# Patient Record
Sex: Female | Born: 1975 | Race: White | Hispanic: Yes | Marital: Married | Smoking: Never smoker
Health system: Southern US, Community
[De-identification: ages and names within clinical notes are randomized; demographics above are authoritative.]

## PROBLEM LIST (undated history)

## (undated) DIAGNOSIS — Z8619 Personal history of other infectious and parasitic diseases: Secondary | ICD-10-CM

## (undated) DIAGNOSIS — E7439 Other disorders of intestinal carbohydrate absorption: Secondary | ICD-10-CM

## (undated) DIAGNOSIS — O24419 Gestational diabetes mellitus in pregnancy, unspecified control: Secondary | ICD-10-CM

## (undated) HISTORY — DX: Other disorders of intestinal carbohydrate absorption: E74.39

## (undated) HISTORY — DX: Personal history of other infectious and parasitic diseases: Z86.19

---

## 2018-04-25 LAB — OB RESULTS CONSOLE GC/CHLAMYDIA
Chlamydia: NEGATIVE
Gonorrhea: NEGATIVE

## 2018-04-25 LAB — OB RESULTS CONSOLE GBS: GBS: NEGATIVE

## 2018-10-16 LAB — CYTOLOGY - PAP: Pap: NEGATIVE

## 2018-10-16 LAB — OB RESULTS CONSOLE PLATELET COUNT: Platelets: 268

## 2018-10-16 LAB — OB RESULTS CONSOLE ANTIBODY SCREEN: Antibody Screen: NEGATIVE

## 2018-10-16 LAB — GLUCOSE, 1 HOUR: Glucose 1 Hour: 202

## 2018-10-16 LAB — OB RESULTS CONSOLE HGB/HCT, BLOOD
HCT: 38 (ref 29–41)
Hemoglobin: 12.6

## 2018-10-16 LAB — OB RESULTS CONSOLE ABO/RH: RH Type: POSITIVE

## 2018-10-16 LAB — OB RESULTS CONSOLE RPR: RPR: NONREACTIVE

## 2018-10-16 LAB — OB RESULTS CONSOLE VARICELLA ZOSTER ANTIBODY, IGG: Varicella: NON-IMMUNE/NOT IMMUNE

## 2018-10-16 LAB — OB RESULTS CONSOLE RUBELLA ANTIBODY, IGM: Rubella: IMMUNE

## 2018-10-16 LAB — OB RESULTS CONSOLE HEPATITIS B SURFACE ANTIGEN: Hepatitis B Surface Ag: NEGATIVE

## 2018-10-16 LAB — CULTURE, OB URINE

## 2018-10-16 LAB — OB RESULTS CONSOLE HIV ANTIBODY (ROUTINE TESTING): HIV: NONREACTIVE

## 2018-10-23 LAB — GLUCOSE, 3 HOUR GESTATIONAL

## 2018-12-06 NOTE — L&D Delivery Note (Signed)
Patient: Heidi Guzman MRN: [id number]  GBS status: Negative, IAP given: None   Patient is a 43 y.o. now G4P4 s/p NSVD at [redacted]w[redacted]d, who was admitted for IOL for Pre-E. SROM 0h 15m prior to delivery with clear fluid.    Delivery Note At 7:09 PM a viable female was delivered via Vaginal, Spontaneous (Presentation: ROA).  APGAR: 9, 9; weight pending.   Placenta status: spontaneous, intact.  Cord: 3 vessel with the following complications: none.    Anesthesia: None     Episiotomy: None Lacerations: None Suture Repair: None Est. Blood Loss (mL): 100  Head delivered ROA. No nuchal cord present. Shoulder and body delivered in usual fashion. Infant with spontaneous cry, placed on mother's abdomen, dried and bulb suctioned. Terminal meconium present. Cord clamped x 2 after 1-minute delay, and cut by family member. Cord blood drawn. Placenta delivered spontaneously with gentle cord traction. Fundus firm with massage and Pitocin. Perineum inspected and found to have no lacerations.  Mom to postpartum.  Baby to Couplet care / Skin to Skin.  De Hollingshead 05/01/2019, 7:48 PM

## 2018-12-07 ENCOUNTER — Encounter: Payer: Self-pay | Admitting: *Deleted

## 2018-12-07 ENCOUNTER — Encounter: Payer: Self-pay | Admitting: Obstetrics & Gynecology

## 2018-12-07 ENCOUNTER — Ambulatory Visit (INDEPENDENT_AMBULATORY_CARE_PROVIDER_SITE_OTHER): Payer: Medicaid Other | Admitting: Obstetrics & Gynecology

## 2018-12-07 ENCOUNTER — Other Ambulatory Visit: Payer: Self-pay

## 2018-12-07 ENCOUNTER — Encounter: Payer: Self-pay | Attending: Obstetrics and Gynecology | Admitting: *Deleted

## 2018-12-07 ENCOUNTER — Ambulatory Visit: Payer: Self-pay | Admitting: *Deleted

## 2018-12-07 DIAGNOSIS — O0992 Supervision of high risk pregnancy, unspecified, second trimester: Secondary | ICD-10-CM | POA: Diagnosis not present

## 2018-12-07 DIAGNOSIS — O09529 Supervision of elderly multigravida, unspecified trimester: Secondary | ICD-10-CM

## 2018-12-07 DIAGNOSIS — O24419 Gestational diabetes mellitus in pregnancy, unspecified control: Secondary | ICD-10-CM | POA: Diagnosis not present

## 2018-12-07 DIAGNOSIS — O0990 Supervision of high risk pregnancy, unspecified, unspecified trimester: Secondary | ICD-10-CM | POA: Insufficient documentation

## 2018-12-07 DIAGNOSIS — O09522 Supervision of elderly multigravida, second trimester: Secondary | ICD-10-CM | POA: Diagnosis not present

## 2018-12-07 DIAGNOSIS — Z3A00 Weeks of gestation of pregnancy not specified: Secondary | ICD-10-CM | POA: Insufficient documentation

## 2018-12-07 DIAGNOSIS — Z713 Dietary counseling and surveillance: Secondary | ICD-10-CM | POA: Insufficient documentation

## 2018-12-07 MED ORDER — ASPIRIN EC 81 MG PO TBEC: 81.0000 mg | DELAYED_RELEASE_TABLET | Freq: Every day | ORAL | 2 refills | Status: DC

## 2018-12-07 NOTE — Patient Instructions (Signed)

## 2018-12-07 NOTE — Progress Notes (Signed)
Patient was seen on 12/07/2018 for Impaired Glucose Tolerance at 17 weeks self-management. EDD 05/2019. Patient speaks Spanish, we had live interpretor here for the visit. Patient states history of this diabetes for 0 years.  Diet history obtained. Patient eats good variety of all food groups but has been avoiding fruits and starches since she was diagnosed. Beverages include only water.  Patient is currently on no diabetes medications.  The following learning objectives were met by the patient :   States the definition of Impaired Glucose Tolerance at 17 weeks   States why dietary management is important in controlling blood glucose  Describes the effects of carbohydrates on blood glucose levels  Demonstrates ability to create a balanced meal plan  Demonstrates carbohydrate counting   States when to check blood glucose levels  Demonstrates proper blood glucose monitoring techniques  States the effect of stress and exercise on blood glucose levels  States the importance of limiting caffeine and abstaining from alcohol and smoking  Plan:   Aim for 3 Carb Choices per meal (45 grams) +/- 1 either way   Aim for 1-2 Carbs per snack  Begin reading food labels for Total Carbohydrate of foods  Consider  increasing your activity level by walking or other activity daily as tolerated  Begin checking BG before breakfast and 2 hours after first bite of breakfast, lunch and dinner as directed by MD   Bring Log Book/Sheet to every medical appointment    Patient not appropriate for Baby Scripts due to language barrier   Take medication as directed by MD  Patient already has a meter: she did not bring it today  And is testing pre breakfast and 2 hours each meal as directed by MD Review of Log Book shows: all numbers under 120 mg/dl. I question accuracy with most numbers ending in multiples of 10 (ie> 90. 100, 110, etc)  She is applying for Medicaid, I suggested she let us know when she gets  coverage and we can Rx the Accu Check Guide meter and supplies for her.   Patient instructed to monitor glucose levels: FBS: 60 - 95 mg/dl 2 hour: <120 mg/dl  Patient received the following handouts:  Nutrition Diabetes and Pregnancy  Carbohydrate Counting List  Patient will be seen for follow-up in 1 month and as needed.

## 2018-12-07 NOTE — Progress Notes (Signed)
  Subjective:    Jackelin Knapper is a [id number] [redacted]w[redacted]d being seen today for her first obstetrical visit.  Her obstetrical history is significant for advanced maternal age and diabetes dx in second trimester. Patient does intend to breast feed. Pregnancy history fully reviewed.  Patient reports no complaints.  Vitals:   12/07/18 1404 12/07/18 1406  BP: 123/68   Pulse: 84   Weight: 145 lb 12.8 oz (66.1 kg)   Height:  5\' 3"  (1.6 m)    HISTORY: OB History  Gravida Para Term Preterm AB Living  4 3 3     3   SAB TAB Ectopic Multiple Live Births          3    # Outcome Date GA Lbr Len/2nd Weight Sex Delivery Anes PTL Lv  4 Current           3 Term 06/02/11   10 lb 2.3 oz (4.6 kg) M Vag-Spont   LIV  2 Term 01/25/08   8 lb 13.1 oz (4 kg) F Vag-Spont   LIV  1 Term 05/09/03   6 lb 9.8 oz (3 kg) F Vag-Spont   LIV   Past Medical History:  Diagnosis Date  . Glucose intolerance   . History of Helicobacter pylori infection    History reviewed. No pertinent surgical history. Family History  Problem Relation Age of Onset  . Diabetes Mother      Exam    Uterus:     Pelvic Exam:                                    Skin: normal coloration and turgor, no rashes    Neurologic: oriented, normal mood   Extremities: normal strength, tone, and muscle mass   HEENT PERRLA and thyroid without masses   Mouth/Teeth mucous membranes moist, pharynx normal without lesions and few caries   Neck supple   Cardiovascular: regular rate and rhythm   Respiratory:  appears well, vitals normal, no respiratory distress, acyanotic, normal RR   Abdomen: soft, non-tender; bowel sounds normal; no masses,  no organomegaly   Urinary:        Assessment:    Pregnancy: [id number] Patient Active Problem List   Diagnosis Date Noted  . Supervision of high risk pregnancy, antepartum 12/07/2018  . AMA (advanced maternal age) multigravida 35+ 12/07/2018  . Gestational diabetes 12/07/2018        Plan:      Initial labs drawn. Prenatal vitamins. Problem list reviewed and updated. Genetic Screening discussed Quad Screen: declined.  Ultrasound discussed; fetal survey: ordered.  Follow up in 2 weeks. 50% of 30 min visit spent on counseling and coordination of care.  Anatomy scan needed, has not had an [place]   Scheryl Darter 12/07/2018

## 2018-12-08 LAB — COMPREHENSIVE METABOLIC PANEL
ALT: 32 IU/L (ref 0–32)
AST: 20 IU/L (ref 0–40)
Albumin/Globulin Ratio: 1.5 (ref 1.2–2.2)
Albumin: 4.3 g/dL (ref 3.5–5.5)
Alkaline Phosphatase: 65 IU/L (ref 39–117)
BILIRUBIN TOTAL: 0.3 mg/dL (ref 0.0–1.2)
BUN/Creatinine Ratio: 28 — ABNORMAL HIGH (ref 9–23)
BUN: 15 mg/dL (ref 6–24)
CALCIUM: 9.7 mg/dL (ref 8.7–10.2)
CO2: 19 mmol/L — ABNORMAL LOW (ref 20–29)
Chloride: 99 mmol/L (ref 96–106)
Creatinine, Ser: 0.54 mg/dL — ABNORMAL LOW (ref 0.57–1.00)
GFR calc non Af Amer: 118 mL/min/{1.73_m2} (ref >59)
GFR, EST AFRICAN AMERICAN: 136 mL/min/{1.73_m2} (ref >59)
Globulin, Total: 2.9 g/dL (ref 1.5–4.5)
Glucose: 81 mg/dL (ref 65–99)
Potassium: 4 mmol/L (ref 3.5–5.2)
Sodium: 135 mmol/L (ref 134–144)
TOTAL PROTEIN: 7.2 g/dL (ref 6.0–8.5)

## 2018-12-08 LAB — PROTEIN / CREATININE RATIO, URINE
Creatinine, Urine: 129.2 mg/dL
Protein, Ur: 20.2 mg/dL
Protein/Creat Ratio: 156 mg/g creat (ref 0–200)

## 2018-12-22 ENCOUNTER — Encounter: Payer: Self-pay | Admitting: Obstetrics and Gynecology

## 2018-12-25 ENCOUNTER — Encounter: Payer: Self-pay | Admitting: Family Medicine

## 2018-12-25 ENCOUNTER — Ambulatory Visit (INDEPENDENT_AMBULATORY_CARE_PROVIDER_SITE_OTHER): Payer: Medicaid Other | Admitting: Family Medicine

## 2018-12-25 VITALS — BP 129/79 | HR 82 | Wt 146.8 lb

## 2018-12-25 DIAGNOSIS — O24410 Gestational diabetes mellitus in pregnancy, diet controlled: Secondary | ICD-10-CM

## 2018-12-25 DIAGNOSIS — O0990 Supervision of high risk pregnancy, unspecified, unspecified trimester: Secondary | ICD-10-CM

## 2018-12-25 DIAGNOSIS — Z3A20 20 weeks gestation of pregnancy: Secondary | ICD-10-CM | POA: Diagnosis not present

## 2018-12-25 DIAGNOSIS — O0992 Supervision of high risk pregnancy, unspecified, second trimester: Secondary | ICD-10-CM

## 2018-12-25 NOTE — Patient Instructions (Signed)
° °Lactancia materna °Breastfeeding ° °Decidir amamantar es una de las mejores elecciones que puede hacer por usted y su bebé. Un cambio en las hormonas durante el embarazo hace que las mamas produzcan leche materna en las glándulas productoras de leche. Las hormonas impiden que la leche materna sea liberada antes del nacimiento del bebé. Además, impulsan el flujo de leche luego del nacimiento. Una vez que ha comenzado a amamantar, pensar en el bebé, así como la succión o el llanto, pueden estimular la liberación de leche de las glándulas productoras de leche. °Los beneficios de amamantar °Las investigaciones demuestran que la lactancia materna ofrece muchos beneficios de salud para bebés y madres. Además, ofrece una forma gratuita y conveniente de alimentar al bebé. °Para el bebé °· La primera leche (calostro) ayuda a mejorar el funcionamiento del aparato digestivo del bebé. °· Las células especiales de la leche (anticuerpos) ayudan a combatir las infecciones en el bebé. °· Los bebés que se alimentan con leche materna también tienen menos probabilidades de tener asma, alergias, obesidad o diabetes de tipo 2. Además, tienen menor riesgo de sufrir el síndrome de muerte súbita del lactante (SMSL). °· Los nutrientes de la leche materna son mejores para satisfacer las necesidades del bebé en comparación con la leche maternizada. °· La leche materna mejora el desarrollo cerebral del bebé. °Para usted °· La lactancia materna favorece el desarrollo de un vínculo muy especial entre la madre y el bebé. °· Es conveniente. La leche materna es económica y siempre está disponible a la temperatura correcta. °· La lactancia materna ayuda a quemar calorías. Le ayuda a perder el peso ganado durante el embarazo. °· Hace que el útero vuelva al tamaño que tenía antes del embarazo más rápido. Además, disminuye el sangrado (loquios) después del parto. °· La lactancia materna contribuye a reducir el riesgo de tener diabetes de tipo 2,  osteoporosis, artritis reumatoide, enfermedades cardiovasculares y cáncer de mama, ovario, útero y endometrio en el futuro. °Información básica sobre la lactancia °Comienzo de la lactancia °· Encuentre un lugar cómodo para sentarse o acostarse, con un buen respaldo para el cuello y la espalda. °· Coloque una almohada o una manta enrollada debajo del bebé para acomodarlo a la altura de la mama (si está sentada). Las almohadas para amamantar se han diseñado especialmente a fin de servir de apoyo para los brazos y el bebé mientras amamanta. °· Asegúrese de que la barriga del bebé (abdomen) esté frente a la suya. °· Masajee suavemente la mama. Con las yemas de los dedos, masajee los bordes exteriores de la mama hacia adentro, en dirección al pezón. Esto estimula el flujo de leche. Si la leche fluye lentamente, es posible que deba continuar con este movimiento durante la lactancia. °· Sostenga la mama con 4 dedos por debajo y el pulgar por arriba del pezón (forme la letra “C” con la mano). Asegúrese de que los dedos se encuentren lejos del pezón y de la boca del bebé. °· Empuje suavemente los labios del bebé con el pezón o con el dedo. °· Cuando la boca del bebé se abra lo suficiente, acérquelo rápidamente a la mama e introduzca todo el pezón y la aréola, tanto como sea posible, dentro de la boca del bebé. La aréola es la zona de color que rodea al pezón. °? Debe haber más aréola visible por arriba del labio superior del bebé que por debajo del labio inferior. °? Los labios del bebé deben estar abiertos y extendidos hacia afuera (evertidos) para asegurar   que el bebé se prenda de forma adecuada y cómoda. °? La lengua del bebé debe estar entre la encía inferior y la mama. °· Asegúrese de que la boca del bebé esté en la posición correcta alrededor del pezón (prendido). Los labios del bebé deben crear un sello sobre la mama y estar doblados hacia afuera (invertidos). °· Es común que el bebé succione durante 2 a 3 minutos  para que comience el flujo de leche materna. °Cómo debe prenderse °Es muy importante que le enseñe al bebé cómo prenderse adecuadamente a la mama. Si el bebé no se prende adecuadamente, puede causar dolor en los pezones, reducir la producción de leche materna y hacer que el bebé tenga un escaso aumento de peso. Además, si el bebé no se prende adecuadamente al pezón, puede tragar aire durante la alimentación. Esto puede causarle molestias al bebé. Hacer eructar al bebé al cambiar de mama puede ayudarlo a liberar el aire. Sin embargo, enseñarle al bebé cómo prenderse a la mama adecuadamente es la mejor manera de evitar que se sienta molesto por tragar aire mientras se alimenta. °Signos de que el bebé se ha prendido adecuadamente al pezón °· Tironea o succiona de modo silencioso, sin causarle dolor. Los labios del bebé deben estar extendidos hacia afuera (evertidos). °· Se escucha que traga cada 3 o 4 succiones una vez que la leche ha comenzado a fluir (después de que se produzca el reflejo de eyección de la leche). °· Hay movimientos musculares por arriba y por delante de sus oídos al succionar. °Signos de que el bebé no se ha prendido adecuadamente al pezón °· Hace ruidos de succión o de chasquido mientras se alimenta. °· Siente dolor en los pezones. °Si cree que el bebé no se prendió correctamente, deslice el dedo en la comisura de la boca y colóquelo entre las encías del bebé para interrumpir la succión. Intente volver a comenzar a amamantar. °Signos de lactancia materna exitosa °Signos del bebé °· El bebé disminuirá gradualmente el número de succiones o dejará de succionar por completo. °· El bebé se quedará dormido. °· El cuerpo del bebé se relajará. °· El bebé retendrá una pequeña cantidad de leche en la boca. °· El bebé se desprenderá solo del pecho. °Signos que presenta usted °· Las mamas han aumentado la firmeza, el peso y el tamaño 1 a 3 horas después de amamantar. °· Están más blandas inmediatamente después  de amamantar. °· Se producen un aumento del volumen de leche y un cambio en su consistencia y color hacia el quinto día de lactancia. °· Los pezones no duelen, no están agrietados ni sangran. °Signos de que su bebé recibe la cantidad de leche suficiente °· Mojar por lo menos 1 o 2 pañales durante las primeras 24 horas después del nacimiento. °· Mojar por lo menos 5 o 6 pañales cada 24 horas durante la primera semana después del nacimiento. La orina debe ser clara o de color amarillo pálido a los 5 días de vida. °· Mojar entre 6 y 8 pañales cada 24 horas a medida que el bebé sigue creciendo y desarrollándose. °· Defeca por lo menos 3 veces en 24 horas a los 5 días de vida. Las heces deben ser blandas y amarillentas. °· Defeca por lo menos 3 veces en 24 horas a los 7 días de vida. Las heces deben ser grumosas y amarillentas. °· No registra una pérdida de peso mayor al 10 % del peso al nacer durante los primeros 3 días de vida. °· Aumenta de peso un promedio de 4   a 7 onzas (113 a 198 g) por semana después de los 4 días de vida. °· Aumenta de peso, diariamente, de manera uniforme a partir de los 5 días de vida, sin registrar pérdida de peso después de las 2 semanas de vida. °Después de alimentarse, es posible que el bebé regurgite una pequeña cantidad de leche. Esto es normal. °Frecuencia y duración de la lactancia °El amamantamiento frecuente la ayudará a producir más leche y puede prevenir dolores en los pezones y las mamas extremadamente llenas (congestión mamaria). Alimente al bebé cuando muestre signos de hambre o si siente la necesidad de reducir la congestión de las mamas. Esto se denomina "lactancia a demanda". Las señales de que el bebé tiene hambre incluyen las siguientes: °· Aumento del estado de alerta, actividad o inquietud. °· Mueve la cabeza de un lado a otro. °· Abre la boca cuando se le toca la mejilla o la comisura de la boca (reflejo de búsqueda). °· Aumenta las vocalizaciones, tales como sonidos de  succión, se relame los labios, emite arrullos, suspiros o chirridos. °· Mueve la mano hacia la boca y se chupa los dedos o las manos. °· Está molesto o llora. °Evite el uso del chupete en las primeras 4 a 6 semanas después del nacimiento del bebé. Después de este período, podrá usar un chupete. Las investigaciones demostraron que el uso del chupete durante el primer año de vida del bebé disminuye el riesgo de tener el síndrome de muerte súbita del lactante (SMSL). °Permita que el niño se alimente en cada mama todo lo que desee. Cuando el bebé se desprende o se queda dormido mientras se está alimentando de la primera mama, ofrézcale la segunda. Debido a que, con frecuencia, los recién nacidos están somnolientos las primeras semanas de vida, es posible que deba despertar al bebé para alimentarlo. °Los horarios de lactancia varían de un bebé a otro. Sin embargo, las siguientes reglas pueden servir como guía para ayudarla a garantizar que el bebé se alimenta adecuadamente: °· Se puede amamantar a los recién nacidos (bebés de 4 semanas o menos de vida) cada 1 a 3 horas. °· No deben transcurrir más de 3 horas durante el día o 5 horas durante la noche sin que se amamante a los recién nacidos. °· Debe amamantar al bebé un mínimo de 8 veces en un período de 24 horas. °Extracción de leche materna ° °  ° °La extracción y el almacenamiento de la leche materna le permiten asegurarse de que el bebé se alimente exclusivamente de su leche materna, aun en momentos en los que no puede amamantar. Esto tiene especial importancia si debe regresar al trabajo en el período en que aún está amamantando o si no puede estar presente en los momentos en que el bebé debe alimentarse. Su asesor en lactancia puede ayudarla a encontrar un método de extracción que funcione mejor para usted y orientarla sobre cuánto tiempo es seguro almacenar leche materna. °Cómo cuidar las mamas durante la lactancia °Los pezones pueden secarse, agrietarse y doler  durante la lactancia. Las siguientes recomendaciones pueden ayudarla a mantener las mamas humectadas y sanas: °· Evite usar jabón en los pezones. °· Use un sostén de soporte diseñado especialmente para la lactancia materna. Evite usar sostenes con aro o sostenes muy ajustados (sostenes deportivos). °· Seque al aire sus pezones durante 3 a 4 minutos después de amamantar al bebé. °· Utilice solo apósitos de algodón en el sostén para absorber las pérdidas de leche. La pérdida de un poco de leche materna entre   las tomas es normal. °· Utilice lanolina sobre los pezones luego de amamantar. La lanolina ayuda a mantener la humedad normal de la piel. La lanolina pura no es perjudicial (no es tóxica) para el bebé. Además, puede extraer manualmente algunas gotas de leche materna y masajear suavemente esa leche sobre los pezones para que la leche se seque al aire. °Durante las primeras semanas después del nacimiento, algunas mujeres experimentan congestión mamaria. La congestión mamaria puede hacer que sienta las mamas pesadas, calientes y sensibles al tacto. El pico de la congestión mamaria ocurre en el plazo de los 3 a 5 días después del parto. Las siguientes recomendaciones pueden ayudarla a aliviar la congestión mamaria: °· Vacíe por completo las mamas al amamantar o extraer leche. Puede aplicar calor húmedo en las mamas (en la ducha o con toallas húmedas para manos) antes de amamantar o extraer leche. Esto aumenta la circulación y ayuda a que la leche fluya. Si el bebé no vacía por completo las mamas cuando lo amamanta, extraiga la leche restante después de que haya finalizado. °· Aplique compresas de hielo sobre las mamas inmediatamente después de amamantar o extraer leche, a menos que le resulte demasiado incómodo. Haga lo siguiente: °? Ponga el hielo en una bolsa plástica. °? Coloque una toalla entre la piel y la bolsa de hielo. °? Coloque el hielo durante 20 minutos, 2 o 3 veces por día. °· Asegúrese de que el bebé  esté prendido y se encuentre en la posición correcta mientras lo alimenta. °Si la congestión mamaria persiste luego de 48 horas o después de seguir estas recomendaciones, comuníquese con su médico o un asesor en lactancia. °Recomendaciones de salud general durante la lactancia °· Consuma 3 comidas y 3 colaciones saludables todos los días. Las madres bien alimentadas que amamantan necesitan entre 450 y 500 calorías adicionales por día. Puede cumplir con este requisito al aumentar la cantidad de una dieta equilibrada que realice. °· Beba suficiente agua para mantener la orina clara o de color amarillo pálido. °· Descanse con frecuencia, relájese y siga tomando sus vitaminas prenatales para prevenir la fatiga, el estrés y los niveles bajos de vitaminas y minerales en el cuerpo (deficiencias de nutrientes). °· No consuma ningún producto que contenga nicotina o tabaco, como cigarrillos y cigarrillos electrónicos. El bebé puede verse afectado por las sustancias químicas de los cigarrillos que pasan a la leche materna y por la exposición al humo ambiental del tabaco. Si necesita ayuda para dejar de fumar, consulte al médico. °· Evite el consumo de alcohol. °· No consuma drogas ilegales o marihuana. °· Antes de usar cualquier medicamento, hable con el médico. Estos incluyen medicamentos recetados y de venta libre, como también vitaminas y suplementos a base de hierbas. Algunos medicamentos, que pueden ser perjudiciales para el bebé, pueden pasar a través de la leche materna. °· Puede quedar embarazada durante la lactancia. Si se desea un método anticonceptivo, consulte al médico sobre cuáles son las opciones seguras durante la lactancia. °Dónde encontrar más información: °Liga internacional [place]: www.llli.org. °Comuníquese con un médico si: °· Siente que quiere dejar de amamantar o se siente frustrada con la lactancia. °· Sus pezones están agrietados o sangran. °· Sus mamas están irritadas, sensibles o  calientes. °· Tiene los siguientes síntomas: °? Dolor en las mamas o en los pezones. °? Un área hinchada en cualquiera de las mamas. °? Fiebre o escalofríos. °? Náuseas o vómitos. °? Drenaje de otro líquido distinto de la leche materna desde los pezones. °· Sus mamas no   se llenan antes de amamantar al bebé para el quinto día después del parto. °· Se siente triste y deprimida. °· El bebé: °? Está demasiado somnoliento como para comer bien. °? Tiene problemas para dormir. °? Tiene más de 1 semana de vida y moja menos de 6 pañales en un periodo de 24 horas. °? No ha aumentado de peso a los 5 días de vida. °· El bebé defeca menos de 3 veces en 24 horas. °· La piel del bebé o las partes blancas de los ojos se vuelven amarillentas. °Solicite ayuda de inmediato si: °· El bebé está muy cansado (letargo) y no se quiere despertar para comer. °· Le sube la fiebre sin causa. °Resumen °· La lactancia materna ofrece muchos beneficios de salud para bebés y madres. °· Intente amamantar a su bebé cuando muestre signos tempranos de hambre. °· Haga cosquillas o empuje suavemente los labios del bebé con el dedo o el pezón para lograr que el bebé abra la boca. Acerque el bebé a la mama. Asegúrese de que la mayor parte de la aréola se encuentre dentro de la boca del bebé. Ofrézcale una mama y haga eructar al bebé antes de pasar a la otra. °· Hable con su médico o asesor en lactancia si tiene dudas o problemas con la lactancia. °Esta información no tiene como fin reemplazar el consejo del médico. Asegúrese de hacerle al médico cualquier pregunta que tenga. °Document Released: 11/22/2005 Document Revised: 03/14/2017 Document Reviewed: 03/14/2017 °Elsevier Interactive Patient Education © 2019 [employer] ° °

## 2018-12-25 NOTE — Progress Notes (Signed)
Anatomy ultrasound scheduled for Wednesday 12/27/18 @ 1100

## 2018-12-25 NOTE — Progress Notes (Signed)
   PRENATAL VISIT NOTE  Subjective:  Heidi Guzman is a 43 y.o. [phone] at [redacted]w[redacted]d being seen today for ongoing prenatal care.  She is currently monitored for the following issues for this high-risk pregnancy and has Supervision of high risk pregnancy, antepartum; AMA (advanced maternal age) multigravida 35+; and Gestational diabetes on their problem list.  Patient reports no complaints.  Contractions: Not present. Vag. Bleeding: None.  Movement: Present. Denies leaking of fluid.   The following portions of the patient's history were reviewed and updated as appropriate: allergies, current medications, past family history, past medical history, past social history, past surgical history and problem list. Problem list updated.  Objective:   Vitals:   12/25/18 1133  BP: 129/79  Pulse: 82  Weight: 146 lb 12.8 oz (66.6 kg)    Fetal Status: Fetal Heart Rate (bpm): 140   Movement: Present     General:  Alert, oriented and cooperative. Patient is in no acute distress.  Skin: Skin is warm and dry. No rash noted.   Cardiovascular: Normal heart rate noted  Respiratory: Normal respiratory effort, no problems with respiration noted  Abdomen: Soft, gravid, appropriate for gestational age.  Pain/Pressure: Absent     Pelvic: Cervical exam deferred        Extremities: Normal range of motion.  Edema: None  Mental Status: Normal mood and affect. Normal behavior. Normal judgment and thought content.   Assessment and Plan:  Pregnancy: [id number] at [redacted]w[redacted]d  1. Diet controlled gestational diabetes mellitus (GDM) in second trimester FBS 81-100 (most in range--need to check meter 2 hr pp 118-125 (most in range--need to check meter, many are similar values) + these values are no where near where she was on the 3 hour GTT with fasting 126 and other values in the 200+ range.  2. Supervision of high risk pregnancy, antepartum Anatomy u/s scheduled Has some neck pain and increasing insomnia. Has 43 yo with  psychosis and other stressors. Wants to resume Magnesium to help with sleep. Ok to resume  General obstetric precautions including but not limited to vaginal bleeding, contractions, leaking of fluid and fetal movement were reviewed in detail with the patient. Please refer to After Visit Summary for other counseling recommendations.  Return in 2 weeks (on 01/08/2019) for prefers female providers.  Future Appointments  Date Time Provider Department Center  12/27/2018 11:00 AM [facility] [place] 3 [facility]-M[facility]-US  01/04/2019  9:00 AM W[facility]-EDUCATION [facility]  01/11/2019 11:15 AM Allie Bossier, MD [facility]    Reva Bores, MD

## 2018-12-27 ENCOUNTER — Ambulatory Visit (HOSPITAL_COMMUNITY)
Admission: RE | Admit: 2018-12-27 | Discharge: 2018-12-27 | Disposition: A | Discharge status: Home / Self Care | Payer: Self-pay | Source: Ambulatory Visit | Attending: Obstetrics & Gynecology | Admitting: Obstetrics & Gynecology

## 2018-12-27 ENCOUNTER — Ambulatory Visit (HOSPITAL_COMMUNITY)
Admission: RE | Admit: 2018-12-27 | Discharge: 2018-12-27 | Disposition: A | Discharge status: Home / Self Care | Payer: Medicaid Other | Source: Ambulatory Visit | Attending: Obstetrics & Gynecology | Admitting: Obstetrics & Gynecology

## 2018-12-27 ENCOUNTER — Encounter (HOSPITAL_COMMUNITY): Payer: Self-pay

## 2018-12-27 ENCOUNTER — Other Ambulatory Visit (HOSPITAL_COMMUNITY): Payer: Self-pay | Admitting: Obstetrics & Gynecology

## 2018-12-27 DIAGNOSIS — Z363 Encounter for antenatal screening for malformations: Secondary | ICD-10-CM

## 2018-12-27 DIAGNOSIS — O09522 Supervision of elderly multigravida, second trimester: Secondary | ICD-10-CM

## 2018-12-27 DIAGNOSIS — O24410 Gestational diabetes mellitus in pregnancy, diet controlled: Secondary | ICD-10-CM

## 2018-12-27 DIAGNOSIS — Z3A19 19 weeks gestation of pregnancy: Secondary | ICD-10-CM

## 2018-12-27 DIAGNOSIS — O0990 Supervision of high risk pregnancy, unspecified, unspecified trimester: Secondary | ICD-10-CM | POA: Insufficient documentation

## 2018-12-27 DIAGNOSIS — O09529 Supervision of elderly multigravida, unspecified trimester: Secondary | ICD-10-CM | POA: Insufficient documentation

## 2018-12-27 HISTORY — DX: Gestational diabetes mellitus in pregnancy, unspecified control: O24.419

## 2018-12-28 ENCOUNTER — Other Ambulatory Visit (HOSPITAL_COMMUNITY): Payer: Self-pay | Admitting: *Deleted

## 2018-12-28 DIAGNOSIS — O09522 Supervision of elderly multigravida, second trimester: Secondary | ICD-10-CM

## 2019-01-04 ENCOUNTER — Encounter: Payer: Self-pay | Admitting: Obstetrics and Gynecology

## 2019-01-04 ENCOUNTER — Other Ambulatory Visit: Payer: Self-pay

## 2019-01-08 ENCOUNTER — Other Ambulatory Visit (HOSPITAL_COMMUNITY): Payer: Self-pay | Admitting: Obstetrics & Gynecology

## 2019-01-10 ENCOUNTER — Encounter: Payer: Self-pay | Admitting: Family Medicine

## 2019-01-10 ENCOUNTER — Ambulatory Visit (INDEPENDENT_AMBULATORY_CARE_PROVIDER_SITE_OTHER): Payer: Self-pay | Admitting: Family Medicine

## 2019-01-10 VITALS — BP 118/74 | HR 84 | Wt 147.0 lb

## 2019-01-10 DIAGNOSIS — O24410 Gestational diabetes mellitus in pregnancy, diet controlled: Secondary | ICD-10-CM

## 2019-01-10 DIAGNOSIS — O09522 Supervision of elderly multigravida, second trimester: Secondary | ICD-10-CM

## 2019-01-10 DIAGNOSIS — O0990 Supervision of high risk pregnancy, unspecified, unspecified trimester: Secondary | ICD-10-CM

## 2019-01-10 DIAGNOSIS — O0992 Supervision of high risk pregnancy, unspecified, second trimester: Secondary | ICD-10-CM

## 2019-01-10 NOTE — Patient Instructions (Signed)
° °Lactancia materna °Breastfeeding ° °Decidir amamantar es una de las mejores elecciones que puede hacer por usted y su bebé. Un cambio en las hormonas durante el embarazo hace que las mamas produzcan leche materna en las glándulas productoras de leche. Las hormonas impiden que la leche materna sea liberada antes del nacimiento del bebé. Además, impulsan el flujo de leche luego del nacimiento. Una vez que ha comenzado a amamantar, pensar en el bebé, así como la succión o el llanto, pueden estimular la liberación de leche de las glándulas productoras de leche. °Los beneficios de amamantar °Las investigaciones demuestran que la lactancia materna ofrece muchos beneficios de salud para bebés y madres. Además, ofrece una forma gratuita y conveniente de alimentar al bebé. °Para el bebé °· La primera leche (calostro) ayuda a mejorar el funcionamiento del aparato digestivo del bebé. °· Las células especiales de la leche (anticuerpos) ayudan a combatir las infecciones en el bebé. °· Los bebés que se alimentan con leche materna también tienen menos probabilidades de tener asma, alergias, obesidad o diabetes de tipo 2. Además, tienen menor riesgo de sufrir el síndrome de muerte súbita del lactante (SMSL). °· Los nutrientes de la leche materna son mejores para satisfacer las necesidades del bebé en comparación con la leche maternizada. °· La leche materna mejora el desarrollo cerebral del bebé. °Para usted °· La lactancia materna favorece el desarrollo de un vínculo muy especial entre la madre y el bebé. °· Es conveniente. La leche materna es económica y siempre está disponible a la temperatura correcta. °· La lactancia materna ayuda a quemar calorías. Le ayuda a perder el peso ganado durante el embarazo. °· Hace que el útero vuelva al tamaño que tenía antes del embarazo más rápido. Además, disminuye el sangrado (loquios) después del parto. °· La lactancia materna contribuye a reducir el riesgo de tener diabetes de tipo 2,  osteoporosis, artritis reumatoide, enfermedades cardiovasculares y cáncer de mama, ovario, útero y endometrio en el futuro. °Información básica sobre la lactancia °Comienzo de la lactancia °· Encuentre un lugar cómodo para sentarse o acostarse, con un buen respaldo para el cuello y la espalda. °· Coloque una almohada o una manta enrollada debajo del bebé para acomodarlo a la altura de la mama (si está sentada). Las almohadas para amamantar se han diseñado especialmente a fin de servir de apoyo para los brazos y el bebé mientras amamanta. °· Asegúrese de que la barriga del bebé (abdomen) esté frente a la suya. °· Masajee suavemente la mama. Con las yemas de los dedos, masajee los bordes exteriores de la mama hacia adentro, en dirección al pezón. Esto estimula el flujo de leche. Si la leche fluye lentamente, es posible que deba continuar con este movimiento durante la lactancia. °· Sostenga la mama con 4 dedos por debajo y el pulgar por arriba del pezón (forme la letra “C” con la mano). Asegúrese de que los dedos se encuentren lejos del pezón y de la boca del bebé. °· Empuje suavemente los labios del bebé con el pezón o con el dedo. °· Cuando la boca del bebé se abra lo suficiente, acérquelo rápidamente a la mama e introduzca todo el pezón y la aréola, tanto como sea posible, dentro de la boca del bebé. La aréola es la zona de color que rodea al pezón. °? Debe haber más aréola visible por arriba del labio superior del bebé que por debajo del labio inferior. °? Los labios del bebé deben estar abiertos y extendidos hacia afuera (evertidos) para asegurar   que el bebé se prenda de forma adecuada y cómoda. °? La lengua del bebé debe estar entre la encía inferior y la mama. °· Asegúrese de que la boca del bebé esté en la posición correcta alrededor del pezón (prendido). Los labios del bebé deben crear un sello sobre la mama y estar doblados hacia afuera (invertidos). °· Es común que el bebé succione durante 2 a 3 minutos  para que comience el flujo de leche materna. °Cómo debe prenderse °Es muy importante que le enseñe al bebé cómo prenderse adecuadamente a la mama. Si el bebé no se prende adecuadamente, puede causar dolor en los pezones, reducir la producción de leche materna y hacer que el bebé tenga un escaso aumento de peso. Además, si el bebé no se prende adecuadamente al pezón, puede tragar aire durante la alimentación. Esto puede causarle molestias al bebé. Hacer eructar al bebé al cambiar de mama puede ayudarlo a liberar el aire. Sin embargo, enseñarle al bebé cómo prenderse a la mama adecuadamente es la mejor manera de evitar que se sienta molesto por tragar aire mientras se alimenta. °Signos de que el bebé se ha prendido adecuadamente al pezón °· Tironea o succiona de modo silencioso, sin causarle dolor. Los labios del bebé deben estar extendidos hacia afuera (evertidos). °· Se escucha que traga cada 3 o 4 succiones una vez que la leche ha comenzado a fluir (después de que se produzca el reflejo de eyección de la leche). °· Hay movimientos musculares por arriba y por delante de sus oídos al succionar. °Signos de que el bebé no se ha prendido adecuadamente al pezón °· Hace ruidos de succión o de chasquido mientras se alimenta. °· Siente dolor en los pezones. °Si cree que el bebé no se prendió correctamente, deslice el dedo en la comisura de la boca y colóquelo entre las encías del bebé para interrumpir la succión. Intente volver a comenzar a amamantar. °Signos de lactancia materna exitosa °Signos del bebé °· El bebé disminuirá gradualmente el número de succiones o dejará de succionar por completo. °· El bebé se quedará dormido. °· El cuerpo del bebé se relajará. °· El bebé retendrá una pequeña cantidad de leche en la boca. °· El bebé se desprenderá solo del pecho. °Signos que presenta usted °· Las mamas han aumentado la firmeza, el peso y el tamaño 1 a 3 horas después de amamantar. °· Están más blandas inmediatamente después  de amamantar. °· Se producen un aumento del volumen de leche y un cambio en su consistencia y color hacia el quinto día de lactancia. °· Los pezones no duelen, no están agrietados ni sangran. °Signos de que su bebé recibe la cantidad de leche suficiente °· Mojar por lo menos 1 o 2 pañales durante las primeras 24 horas después del nacimiento. °· Mojar por lo menos 5 o 6 pañales cada 24 horas durante la primera semana después del nacimiento. La orina debe ser clara o de color amarillo pálido a los 5 días de vida. °· Mojar entre 6 y 8 pañales cada 24 horas a medida que el bebé sigue creciendo y desarrollándose. °· Defeca por lo menos 3 veces en 24 horas a los 5 días de vida. Las heces deben ser blandas y amarillentas. °· Defeca por lo menos 3 veces en 24 horas a los 7 días de vida. Las heces deben ser grumosas y amarillentas. °· No registra una pérdida de peso mayor al 10 % del peso al nacer durante los primeros 3 días de vida. °· Aumenta de peso un promedio de 4   a 7 onzas (113 a 198 g) por semana después de los 4 días de vida. °· Aumenta de peso, diariamente, de manera uniforme a partir de los 5 días de vida, sin registrar pérdida de peso después de las 2 semanas de vida. °Después de alimentarse, es posible que el bebé regurgite una pequeña cantidad de leche. Esto es normal. °Frecuencia y duración de la lactancia °El amamantamiento frecuente la ayudará a producir más leche y puede prevenir dolores en los pezones y las mamas extremadamente llenas (congestión mamaria). Alimente al bebé cuando muestre signos de hambre o si siente la necesidad de reducir la congestión de las mamas. Esto se denomina "lactancia a demanda". Las señales de que el bebé tiene hambre incluyen las siguientes: °· Aumento del estado de alerta, actividad o inquietud. °· Mueve la cabeza de un lado a otro. °· Abre la boca cuando se le toca la mejilla o la comisura de la boca (reflejo de búsqueda). °· Aumenta las vocalizaciones, tales como sonidos de  succión, se relame los labios, emite arrullos, suspiros o chirridos. °· Mueve la mano hacia la boca y se chupa los dedos o las manos. °· Está molesto o llora. °Evite el uso del chupete en las primeras 4 a 6 semanas después del nacimiento del bebé. Después de este período, podrá usar un chupete. Las investigaciones demostraron que el uso del chupete durante el primer año de vida del bebé disminuye el riesgo de tener el síndrome de muerte súbita del lactante (SMSL). °Permita que el niño se alimente en cada mama todo lo que desee. Cuando el bebé se desprende o se queda dormido mientras se está alimentando de la primera mama, ofrézcale la segunda. Debido a que, con frecuencia, los recién nacidos están somnolientos las primeras semanas de vida, es posible que deba despertar al bebé para alimentarlo. °Los horarios de lactancia varían de un bebé a otro. Sin embargo, las siguientes reglas pueden servir como guía para ayudarla a garantizar que el bebé se alimenta adecuadamente: °· Se puede amamantar a los recién nacidos (bebés de 4 semanas o menos de vida) cada 1 a 3 horas. °· No deben transcurrir más de 3 horas durante el día o 5 horas durante la noche sin que se amamante a los recién nacidos. °· Debe amamantar al bebé un mínimo de 8 veces en un período de 24 horas. °Extracción de leche materna ° °  ° °La extracción y el almacenamiento de la leche materna le permiten asegurarse de que el bebé se alimente exclusivamente de su leche materna, aun en momentos en los que no puede amamantar. Esto tiene especial importancia si debe regresar al trabajo en el período en que aún está amamantando o si no puede estar presente en los momentos en que el bebé debe alimentarse. Su asesor en lactancia puede ayudarla a encontrar un método de extracción que funcione mejor para usted y orientarla sobre cuánto tiempo es seguro almacenar leche materna. °Cómo cuidar las mamas durante la lactancia °Los pezones pueden secarse, agrietarse y doler  durante la lactancia. Las siguientes recomendaciones pueden ayudarla a mantener las mamas humectadas y sanas: °· Evite usar jabón en los pezones. °· Use un sostén de soporte diseñado especialmente para la lactancia materna. Evite usar sostenes con aro o sostenes muy ajustados (sostenes deportivos). °· Seque al aire sus pezones durante 3 a 4 minutos después de amamantar al bebé. °· Utilice solo apósitos de algodón en el sostén para absorber las pérdidas de leche. La pérdida de un poco de leche materna entre   las tomas es normal. °· Utilice lanolina sobre los pezones luego de amamantar. La lanolina ayuda a mantener la humedad normal de la piel. La lanolina pura no es perjudicial (no es tóxica) para el bebé. Además, puede extraer manualmente algunas gotas de leche materna y masajear suavemente esa leche sobre los pezones para que la leche se seque al aire. °Durante las primeras semanas después del nacimiento, algunas mujeres experimentan congestión mamaria. La congestión mamaria puede hacer que sienta las mamas pesadas, calientes y sensibles al tacto. El pico de la congestión mamaria ocurre en el plazo de los 3 a 5 días después del parto. Las siguientes recomendaciones pueden ayudarla a aliviar la congestión mamaria: °· Vacíe por completo las mamas al amamantar o extraer leche. Puede aplicar calor húmedo en las mamas (en la ducha o con toallas húmedas para manos) antes de amamantar o extraer leche. Esto aumenta la circulación y ayuda a que la leche fluya. Si el bebé no vacía por completo las mamas cuando lo amamanta, extraiga la leche restante después de que haya finalizado. °· Aplique compresas de hielo sobre las mamas inmediatamente después de amamantar o extraer leche, a menos que le resulte demasiado incómodo. Haga lo siguiente: °? Ponga el hielo en una bolsa plástica. °? Coloque una toalla entre la piel y la bolsa de hielo. °? Coloque el hielo durante 20 minutos, 2 o 3 veces por día. °· Asegúrese de que el bebé  esté prendido y se encuentre en la posición correcta mientras lo alimenta. °Si la congestión mamaria persiste luego de 48 horas o después de seguir estas recomendaciones, comuníquese con su médico o un asesor en lactancia. °Recomendaciones de salud general durante la lactancia °· Consuma 3 comidas y 3 colaciones saludables todos los días. Las madres bien alimentadas que amamantan necesitan entre 450 y 500 calorías adicionales por día. Puede cumplir con este requisito al aumentar la cantidad de una dieta equilibrada que realice. °· Beba suficiente agua para mantener la orina clara o de color amarillo pálido. °· Descanse con frecuencia, relájese y siga tomando sus vitaminas prenatales para prevenir la fatiga, el estrés y los niveles bajos de vitaminas y minerales en el cuerpo (deficiencias de nutrientes). °· No consuma ningún producto que contenga nicotina o tabaco, como cigarrillos y cigarrillos electrónicos. El bebé puede verse afectado por las sustancias químicas de los cigarrillos que pasan a la leche materna y por la exposición al humo ambiental del tabaco. Si necesita ayuda para dejar de fumar, consulte al médico. °· Evite el consumo de alcohol. °· No consuma drogas ilegales o marihuana. °· Antes de usar cualquier medicamento, hable con el médico. Estos incluyen medicamentos recetados y de venta libre, como también vitaminas y suplementos a base de hierbas. Algunos medicamentos, que pueden ser perjudiciales para el bebé, pueden pasar a través de la leche materna. °· Puede quedar embarazada durante la lactancia. Si se desea un método anticonceptivo, consulte al médico sobre cuáles son las opciones seguras durante la lactancia. °Dónde encontrar más información: °Liga internacional [place]: www.llli.org. °Comuníquese con un médico si: °· Siente que quiere dejar de amamantar o se siente frustrada con la lactancia. °· Sus pezones están agrietados o sangran. °· Sus mamas están irritadas, sensibles o  calientes. °· Tiene los siguientes síntomas: °? Dolor en las mamas o en los pezones. °? Un área hinchada en cualquiera de las mamas. °? Fiebre o escalofríos. °? Náuseas o vómitos. °? Drenaje de otro líquido distinto de la leche materna desde los pezones. °· Sus mamas no   se llenan antes de amamantar al bebé para el quinto día después del parto. °· Se siente triste y deprimida. °· El bebé: °? Está demasiado somnoliento como para comer bien. °? Tiene problemas para dormir. °? Tiene más de 1 semana de vida y moja menos de 6 pañales en un periodo de 24 horas. °? No ha aumentado de peso a los 5 días de vida. °· El bebé defeca menos de 3 veces en 24 horas. °· La piel del bebé o las partes blancas de los ojos se vuelven amarillentas. °Solicite ayuda de inmediato si: °· El bebé está muy cansado (letargo) y no se quiere despertar para comer. °· Le sube la fiebre sin causa. °Resumen °· La lactancia materna ofrece muchos beneficios de salud para bebés y madres. °· Intente amamantar a su bebé cuando muestre signos tempranos de hambre. °· Haga cosquillas o empuje suavemente los labios del bebé con el dedo o el pezón para lograr que el bebé abra la boca. Acerque el bebé a la mama. Asegúrese de que la mayor parte de la aréola se encuentre dentro de la boca del bebé. Ofrézcale una mama y haga eructar al bebé antes de pasar a la otra. °· Hable con su médico o asesor en lactancia si tiene dudas o problemas con la lactancia. °Esta información no tiene como fin reemplazar el consejo del médico. Asegúrese de hacerle al médico cualquier pregunta que tenga. °Document Released: 11/22/2005 Document Revised: 03/14/2017 Document Reviewed: 03/14/2017 °Elsevier Interactive Patient Education © 2019 [employer] ° °

## 2019-01-10 NOTE — Progress Notes (Signed)
   PRENATAL VISIT NOTE  Subjective:  Heidi Guzman is a 43 y.o. [phone] at [redacted]w[redacted]d being seen today for ongoing prenatal care.  She is currently monitored for the following issues for this high-risk pregnancy and has Supervision of high risk pregnancy, antepartum; AMA (advanced maternal age) multigravida 35+; and Gestational diabetes on their problem list.  Patient reports no complaints.  Contractions: Not present. Vag. Bleeding: None.  Movement: Present. Denies leaking of fluid.   The following portions of the patient's history were reviewed and updated as appropriate: allergies, current medications, past family history, past medical history, past social history, past surgical history and problem list. Problem list updated.  Objective:   Vitals:   01/10/19 0941  BP: 118/74  Pulse: 84  Weight: 147 lb (66.7 kg)    Fetal Status: Fetal Heart Rate (bpm): 141   Movement: Present     General:  Alert, oriented and cooperative. Patient is in no acute distress.  Skin: Skin is warm and dry. No rash noted.   Cardiovascular: Normal heart rate noted  Respiratory: Normal respiratory effort, no problems with respiration noted  Abdomen: Soft, gravid, appropriate for gestational age.  Pain/Pressure: Absent     Pelvic: Cervical exam deferred        Extremities: Normal range of motion.  Edema: None  Mental Status: Normal mood and affect. Normal behavior. Normal judgment and thought content.   Assessment and Plan:  Pregnancy: G4P3003 at [redacted]w[redacted]d  1. Multigravida of advanced maternal age in second trimester NIPT pending from MFM  2. Diet controlled gestational diabetes mellitus (GDM) in second trimester FBS 85-100 2 hr pp 120-131 Most are out of range. Has appt. With diabetes educator on 01/16/2019--many of these values are similar in range. She was hesitant to start meds, will work on diet and addition of exercise to try to maintain CBGs. Unfortunately, her 2 hour values were not near the values  she is bringing in with her. Brought meter, but only 3 values, all normal  3. Supervision of high risk pregnancy, antepartum F/u completion of anatomy US is already scheduled.  General obstetric precautions including but not limited to vaginal bleeding, contractions, leaking of fluid and fetal movement were reviewed in detail with the patient. Please refer to After Visit Summary for other counseling recommendations.  Return in 2 weeks (on 01/24/2019).  Future Appointments  Date Time Provider Department Center  01/16/2019 10:00 AM WOC-EDUCATION [facility]  01/24/2019 11:15 AM WH-MFC [place] 4 WH-MFCUS MFC-US  01/26/2019 10:15 AM Levie Heritage, DO [facility] WOC    Reva Bores, MD

## 2019-01-11 ENCOUNTER — Encounter: Payer: Self-pay | Admitting: Obstetrics & Gynecology

## 2019-01-11 ENCOUNTER — Encounter: Payer: Self-pay | Admitting: Obstetrics and Gynecology

## 2019-01-16 ENCOUNTER — Other Ambulatory Visit: Payer: Self-pay

## 2019-01-23 ENCOUNTER — Other Ambulatory Visit: Payer: Self-pay

## 2019-01-24 ENCOUNTER — Ambulatory Visit (HOSPITAL_COMMUNITY)
Admission: RE | Admit: 2019-01-24 | Discharge: 2019-01-24 | Disposition: A | Discharge status: Home / Self Care | Payer: Self-pay | Source: Ambulatory Visit | Attending: Obstetrics & Gynecology | Admitting: Obstetrics & Gynecology

## 2019-01-24 ENCOUNTER — Other Ambulatory Visit (HOSPITAL_COMMUNITY): Payer: Self-pay | Admitting: *Deleted

## 2019-01-24 ENCOUNTER — Encounter (HOSPITAL_COMMUNITY): Payer: Self-pay

## 2019-01-24 DIAGNOSIS — O09522 Supervision of elderly multigravida, second trimester: Secondary | ICD-10-CM | POA: Insufficient documentation

## 2019-01-24 DIAGNOSIS — O35EXX0 Maternal care for other (suspected) fetal abnormality and damage, fetal genitourinary anomalies, not applicable or unspecified: Secondary | ICD-10-CM

## 2019-01-24 DIAGNOSIS — Z3A23 23 weeks gestation of pregnancy: Secondary | ICD-10-CM

## 2019-01-24 DIAGNOSIS — O358XX0 Maternal care for other (suspected) fetal abnormality and damage, not applicable or unspecified: Secondary | ICD-10-CM

## 2019-01-24 DIAGNOSIS — O24410 Gestational diabetes mellitus in pregnancy, diet controlled: Secondary | ICD-10-CM

## 2019-01-25 ENCOUNTER — Other Ambulatory Visit: Payer: Self-pay

## 2019-01-26 ENCOUNTER — Ambulatory Visit (INDEPENDENT_AMBULATORY_CARE_PROVIDER_SITE_OTHER): Payer: Self-pay | Admitting: Family Medicine

## 2019-01-26 VITALS — BP 108/69 | HR 81 | Wt 145.1 lb

## 2019-01-26 DIAGNOSIS — O0992 Supervision of high risk pregnancy, unspecified, second trimester: Secondary | ICD-10-CM

## 2019-01-26 DIAGNOSIS — O24410 Gestational diabetes mellitus in pregnancy, diet controlled: Secondary | ICD-10-CM

## 2019-01-26 DIAGNOSIS — O0990 Supervision of high risk pregnancy, unspecified, unspecified trimester: Secondary | ICD-10-CM

## 2019-01-26 DIAGNOSIS — O09522 Supervision of elderly multigravida, second trimester: Secondary | ICD-10-CM

## 2019-01-26 DIAGNOSIS — O09529 Supervision of elderly multigravida, unspecified trimester: Secondary | ICD-10-CM

## 2019-01-26 DIAGNOSIS — Z3A23 23 weeks gestation of pregnancy: Secondary | ICD-10-CM

## 2019-01-26 LAB — POCT URINALYSIS DIP (DEVICE)
Glucose, UA: 500 mg/dL — AB
Hgb urine dipstick: NEGATIVE
Ketones, ur: 40 mg/dL — AB
Leukocytes,Ua: NEGATIVE
NITRITE: NEGATIVE
PH: 5.5 (ref 5.0–8.0)
Protein, ur: 30 mg/dL — AB
Specific Gravity, Urine: 1.025 (ref 1.005–1.030)
Urobilinogen, UA: 1 mg/dL (ref 0.0–1.0)

## 2019-01-26 NOTE — Progress Notes (Signed)
[employer]preter [phone]

## 2019-01-26 NOTE — Progress Notes (Signed)
Subjective:  [employer] is a 43 y.o. [phone] at [redacted]w[redacted]d being seen today for ongoing prenatal care.  She is currently monitored for the following issues for this high-risk pregnancy and has Supervision of high risk pregnancy, antepartum; AMA (advanced maternal age) multigravida 35+; and Gestational diabetes on their problem list.  GDM: Patient diet controlled.  Fasting: mostly controlled, but elevated the past few days from not sleeping well 2hr PP: 91-128 (mostly around 120)  Patient reports difficulty sleeping.  Contractions: Not present. Vag. Bleeding: None.  Movement: Present. Denies leaking of fluid.   The following portions of the patient's history were reviewed and updated as appropriate: allergies, current medications, past family history, past medical history, past social history, past surgical history and problem list. Problem list updated.  Objective:   Vitals:   01/26/19 1024  BP: 108/69  Pulse: 81  Weight: 145 lb 1.6 oz (65.8 kg)    Fetal Status: Fetal Heart Rate (bpm): 140   Movement: Present     General:  Alert, oriented and cooperative. Patient is in no acute distress.  Skin: Skin is warm and dry. No rash noted.   Cardiovascular: Normal heart rate noted  Respiratory: Normal respiratory effort, no problems with respiration noted  Abdomen: Soft, gravid, appropriate for gestational age. Pain/Pressure: Absent     Pelvic: Vag. Bleeding: None     Cervical exam deferred        Extremities: Normal range of motion.  Edema: None  Mental Status: Normal mood and affect. Normal behavior. Normal judgment and thought content.   Urinalysis:      Assessment and Plan:  Pregnancy: G4P3003 at [redacted]w[redacted]d  1. Supervision of high risk pregnancy, antepartum FHT and FH normal  2. Multigravida of advanced maternal age in second trimester Continue ASA 81mg   3. Diet controlled gestational diabetes mellitus (GDM) in second trimester Continue diet control for now - return in two  weeks. If still elevated, may need to start medicine. [place] in 4 weeks for growth  4. Antepartum multigravida of advanced maternal age   Preterm labor symptoms and general obstetric precautions including but not limited to vaginal bleeding, contractions, leaking of fluid and fetal movement were reviewed in detail with the patient. Please refer to After Visit Summary for other counseling recommendations.  No follow-ups on file.   Levie Heritage, DO

## 2019-01-30 ENCOUNTER — Encounter: Payer: Self-pay | Attending: Obstetrics and Gynecology | Admitting: *Deleted

## 2019-01-30 ENCOUNTER — Ambulatory Visit: Payer: Self-pay | Admitting: *Deleted

## 2019-01-30 DIAGNOSIS — Z3A00 Weeks of gestation of pregnancy not specified: Secondary | ICD-10-CM | POA: Insufficient documentation

## 2019-01-30 DIAGNOSIS — Z713 Dietary counseling and surveillance: Secondary | ICD-10-CM | POA: Insufficient documentation

## 2019-01-30 DIAGNOSIS — O24419 Gestational diabetes mellitus in pregnancy, unspecified control: Secondary | ICD-10-CM | POA: Insufficient documentation

## 2019-01-30 NOTE — Progress Notes (Signed)
Patient was seen on 01/30/2019 for follow up visit for Impaired Glucose Tolerance at 24 weeks self-management. EDD 05/2019. Patient speaks Spanish, we had live interpretor here for the visit. Patient continues to eat good variety of all food groups.   Beverages include only water. She has BG Log sheet, FBG are elevated for the past week in the low 100's and there are no readings for post meal blood sugars for the past week. Dr. Nehemiah Settle has addressed her recent BG numbers and per his notes, plans to review at next appointment. She states her daughter was in the hospital and she has had many MD appointments for her, so not able to check BG every day. She also states she was having trouble with her meter, had to buy a new one. She does not have insurance, so I will provide her with a meter and supplies today.  Patient is currently on no diabetes medications.  The following learning objectives were met by the patient :   Provided new True Track meter and supplies, instructed her   how to use it.    States when to check blood glucose levels  Demonstrates proper blood glucose monitoring techniques  Plan:   Aim for 3 Carb Choices per meal (45 grams) +/- 1 either way   Aim for 1-2 Carbs per snack  Continue reading food labels for Total Carbohydrate of foods  Continue  increasing your activity level by walking or other activity daily as tolerated  Continue checking BG before breakfast and 2 hours after first bite of breakfast, lunch and dinner as directed by MD   Bring Log Book/Sheet to every medical appointment    Patient not appropriate for Baby Scripts due to language barrier   Take medication as directed by MD  Provided True Track meter today Lot # [id number] Exp Date 07/05/2020  Patient instructed to monitor glucose levels: FBS: 60 - 95 mg/dl 2 hour: <120 mg/dl  Patient states she already has the following handouts:in Spanish  Nutrition Diabetes and Pregnancy  Carbohydrate Counting  List  Patient will be seen for follow-up in 1 month and as needed.

## 2019-02-12 ENCOUNTER — Encounter: Payer: Self-pay | Admitting: Family Medicine

## 2019-02-14 ENCOUNTER — Encounter: Payer: Self-pay | Admitting: Obstetrics and Gynecology

## 2019-02-21 ENCOUNTER — Ambulatory Visit (HOSPITAL_COMMUNITY): Payer: Self-pay | Admitting: *Deleted

## 2019-02-21 ENCOUNTER — Ambulatory Visit (HOSPITAL_COMMUNITY)
Admission: RE | Admit: 2019-02-21 | Discharge: 2019-02-21 | Disposition: A | Discharge status: Home / Self Care | Payer: Self-pay | Source: Ambulatory Visit | Attending: Family Medicine | Admitting: Family Medicine

## 2019-02-21 ENCOUNTER — Encounter (HOSPITAL_COMMUNITY): Payer: Self-pay

## 2019-02-21 ENCOUNTER — Other Ambulatory Visit (HOSPITAL_COMMUNITY): Payer: Self-pay | Admitting: *Deleted

## 2019-02-21 ENCOUNTER — Other Ambulatory Visit: Payer: Self-pay

## 2019-02-21 VITALS — BP 118/67 | HR 74 | Temp 97.8°F | Wt 146.8 lb

## 2019-02-21 DIAGNOSIS — Z3A27 27 weeks gestation of pregnancy: Secondary | ICD-10-CM

## 2019-02-21 DIAGNOSIS — O24410 Gestational diabetes mellitus in pregnancy, diet controlled: Secondary | ICD-10-CM

## 2019-02-21 DIAGNOSIS — O09523 Supervision of elderly multigravida, third trimester: Secondary | ICD-10-CM | POA: Insufficient documentation

## 2019-02-21 DIAGNOSIS — O09522 Supervision of elderly multigravida, second trimester: Secondary | ICD-10-CM

## 2019-02-21 DIAGNOSIS — Z362 Encounter for other antenatal screening follow-up: Secondary | ICD-10-CM

## 2019-02-21 DIAGNOSIS — O358XX0 Maternal care for other (suspected) fetal abnormality and damage, not applicable or unspecified: Secondary | ICD-10-CM | POA: Insufficient documentation

## 2019-02-21 DIAGNOSIS — O35EXX0 Maternal care for other (suspected) fetal abnormality and damage, fetal genitourinary anomalies, not applicable or unspecified: Secondary | ICD-10-CM

## 2019-02-22 ENCOUNTER — Encounter: Payer: Self-pay | Admitting: Family Medicine

## 2019-02-26 ENCOUNTER — Encounter: Payer: Self-pay | Admitting: Obstetrics & Gynecology

## 2019-03-01 ENCOUNTER — Other Ambulatory Visit: Payer: Self-pay

## 2019-03-09 ENCOUNTER — Telehealth: Payer: Self-pay | Admitting: Obstetrics and Gynecology

## 2019-03-09 NOTE — Telephone Encounter (Signed)
Called the patient with the spanish interrupter id [id number]. Informed of the restrictions due to the COVID19. She has no questions at this time.

## 2019-03-12 ENCOUNTER — Encounter: Payer: Self-pay | Admitting: Family Medicine

## 2019-03-28 ENCOUNTER — Other Ambulatory Visit (HOSPITAL_COMMUNITY): Payer: Self-pay | Admitting: *Deleted

## 2019-03-28 ENCOUNTER — Encounter (HOSPITAL_COMMUNITY): Payer: Self-pay

## 2019-03-28 ENCOUNTER — Ambulatory Visit (HOSPITAL_COMMUNITY): Payer: Self-pay | Admitting: *Deleted

## 2019-03-28 ENCOUNTER — Ambulatory Visit (HOSPITAL_COMMUNITY)
Admission: RE | Admit: 2019-03-28 | Discharge: 2019-03-28 | Disposition: A | Discharge status: Home / Self Care | Payer: Self-pay | Source: Ambulatory Visit | Attending: Obstetrics and Gynecology | Admitting: Obstetrics and Gynecology

## 2019-03-28 ENCOUNTER — Other Ambulatory Visit: Payer: Self-pay

## 2019-03-28 VITALS — BP 140/79 | HR 84 | Temp 98.0°F

## 2019-03-28 DIAGNOSIS — O09523 Supervision of elderly multigravida, third trimester: Secondary | ICD-10-CM

## 2019-03-28 DIAGNOSIS — O24410 Gestational diabetes mellitus in pregnancy, diet controlled: Secondary | ICD-10-CM

## 2019-03-28 DIAGNOSIS — O09522 Supervision of elderly multigravida, second trimester: Secondary | ICD-10-CM

## 2019-03-28 DIAGNOSIS — Z362 Encounter for other antenatal screening follow-up: Secondary | ICD-10-CM

## 2019-03-28 DIAGNOSIS — Z3A32 32 weeks gestation of pregnancy: Secondary | ICD-10-CM

## 2019-04-02 ENCOUNTER — Encounter: Payer: Self-pay | Admitting: Family Medicine

## 2019-04-02 ENCOUNTER — Other Ambulatory Visit: Payer: Self-pay | Admitting: *Deleted

## 2019-04-02 ENCOUNTER — Other Ambulatory Visit: Payer: Self-pay

## 2019-04-02 ENCOUNTER — Encounter: Payer: Self-pay | Admitting: Obstetrics and Gynecology

## 2019-04-02 ENCOUNTER — Ambulatory Visit (INDEPENDENT_AMBULATORY_CARE_PROVIDER_SITE_OTHER): Payer: Self-pay | Admitting: Obstetrics and Gynecology

## 2019-04-02 VITALS — BP 138/86 | HR 73 | Temp 97.9°F | Wt 152.1 lb

## 2019-04-02 DIAGNOSIS — O0990 Supervision of high risk pregnancy, unspecified, unspecified trimester: Secondary | ICD-10-CM

## 2019-04-02 DIAGNOSIS — O09523 Supervision of elderly multigravida, third trimester: Secondary | ICD-10-CM

## 2019-04-02 DIAGNOSIS — Z789 Other specified health status: Secondary | ICD-10-CM | POA: Insufficient documentation

## 2019-04-02 DIAGNOSIS — O24410 Gestational diabetes mellitus in pregnancy, diet controlled: Secondary | ICD-10-CM

## 2019-04-02 DIAGNOSIS — Z3A32 32 weeks gestation of pregnancy: Secondary | ICD-10-CM

## 2019-04-02 NOTE — Progress Notes (Signed)
Prenatal Visit Note Date: 04/02/2019 Clinic: [facility]  Subjective:  Heidi Guzman is a 43 y.o. [phone] at [redacted]w[redacted]d being seen today for ongoing prenatal care.  She is currently monitored for the following issues for this high-risk pregnancy and has Supervision of high risk pregnancy, antepartum; AMA (advanced maternal age) multigravida 35+; Gestational diabetes; and Language barrier on their problem list.  Patient reports no complaints.   Contractions: Not present. Vag. Bleeding: None.  Movement: Present. Denies leaking of fluid.   The following portions of the patient's history were reviewed and updated as appropriate: allergies, current medications, past family history, past medical history, past social history, past surgical history and problem list. Problem list updated.  Objective:   Vitals:   04/02/19 1022  BP: 138/86  Pulse: 73  Temp: 97.9 F (36.6 C)  Weight: 152 lb 1.6 oz (69 kg)    Fetal Status: Fetal Heart Rate (bpm): 152   Movement: Present     General:  Alert, oriented and cooperative. Patient is in no acute distress.  Skin: Skin is warm and dry. No rash noted.   Cardiovascular: Normal heart rate noted  Respiratory: Normal respiratory effort, no problems with respiration noted  Abdomen: Soft, gravid, appropriate for gestational age. Pain/Pressure: Present     Pelvic:  Cervical exam deferred        Extremities: Normal range of motion.  Edema: None  Mental Status: Normal mood and affect. Normal behavior. Normal judgment and thought content.   Urinalysis:      Assessment and Plan:  Pregnancy: G4P3003 at [redacted]w[redacted]d  1. Supervision of high risk pregnancy, antepartum Routine care. Ask about BC nv  2. GDMa1 Gives normal 2h PP #s and borderline AM fasting ones. Pt does snack after dinner and in the middle of night and gave suggestions for lower carb/sugar foods. Has surveillance growth scheduled for 5/22 with bpp.  Recommend qwk ap testing  thereafter given over 40 with gdm   Interpreter used  Preterm labor symptoms and general obstetric precautions including but not limited to vaginal bleeding, contractions, leaking of fluid and fetal movement were reviewed in detail with the patient. Please refer to After Visit Summary for other counseling recommendations.  Return in about 2 weeks (around 04/16/2019) for hrob in person.   [place] Bing, MD

## 2019-04-03 LAB — CBC
Hematocrit: 32.6 % — ABNORMAL LOW (ref 34.0–46.6)
Hemoglobin: 11.2 g/dL (ref 11.1–15.9)
MCH: 27.8 pg (ref 26.6–33.0)
MCHC: 34.4 g/dL (ref 31.5–35.7)
MCV: 81 fL (ref 79–97)
Platelets: 183 10*3/uL (ref 150–450)
RBC: 4.03 x10E6/uL (ref 3.77–5.28)
RDW: 13.8 % (ref 11.7–15.4)
WBC: 7.8 10*3/uL (ref 3.4–10.8)

## 2019-04-03 LAB — RPR: RPR Ser Ql: NONREACTIVE

## 2019-04-03 LAB — HIV ANTIBODY (ROUTINE TESTING W REFLEX): HIV Screen 4th Generation wRfx: NONREACTIVE

## 2019-04-16 ENCOUNTER — Other Ambulatory Visit: Payer: Self-pay

## 2019-04-16 ENCOUNTER — Telehealth: Payer: Self-pay | Admitting: Obstetrics & Gynecology

## 2019-04-16 ENCOUNTER — Ambulatory Visit (INDEPENDENT_AMBULATORY_CARE_PROVIDER_SITE_OTHER): Payer: Self-pay | Admitting: Obstetrics & Gynecology

## 2019-04-16 VITALS — BP 141/88 | HR 79 | Temp 97.5°F | Wt 155.5 lb

## 2019-04-16 DIAGNOSIS — O0990 Supervision of high risk pregnancy, unspecified, unspecified trimester: Secondary | ICD-10-CM

## 2019-04-16 DIAGNOSIS — Z789 Other specified health status: Secondary | ICD-10-CM

## 2019-04-16 DIAGNOSIS — Z3A34 34 weeks gestation of pregnancy: Secondary | ICD-10-CM

## 2019-04-16 DIAGNOSIS — O24410 Gestational diabetes mellitus in pregnancy, diet controlled: Secondary | ICD-10-CM

## 2019-04-16 DIAGNOSIS — O09523 Supervision of elderly multigravida, third trimester: Secondary | ICD-10-CM

## 2019-04-16 MED ORDER — METFORMIN HCL 500 MG PO TABS: ORAL_TABLET | ORAL | 3 refills | Status: DC

## 2019-04-16 NOTE — Telephone Encounter (Signed)
Called the patient to confirm the appointment, left a detailed voicemail message with our new location and appointment time. °

## 2019-04-16 NOTE — Progress Notes (Signed)
   PRENATAL VISIT NOTE  Subjective:  Heidi Guzman is a 43 y.o. [phone] at [redacted]w[redacted]d being seen today for ongoing prenatal care.  She is currently monitored for the following issues for this high-risk pregnancy and has Supervision of high risk pregnancy, antepartum; AMA (advanced maternal age) multigravida 35+; Gestational diabetes; and Language barrier on their problem list.  Patient reports no complaints.   .  .   . Denies leaking of fluid.   The following portions of the patient's history were reviewed and updated as appropriate: allergies, current medications, past family history, past medical history, past social history, past surgical history and problem list.   Objective:  There were no vitals filed for this visit.  Fetal Status:           General:  Alert, oriented and cooperative. Patient is in no acute distress.  Skin: Skin is warm and dry. No rash noted.   Cardiovascular: Normal heart rate noted  Respiratory: Normal respiratory effort, no problems with respiration noted  Abdomen: Soft, gravid, appropriate for gestational age.        Pelvic: Cervical exam deferred        Extremities: Normal range of motion.     Mental Status: Normal mood and affect. Normal behavior. Normal judgment and thought content.   Assessment and Plan:  Pregnancy: G4P3003 at [redacted]w[redacted]d 1. Language barrier - live interpretor  2. Supervision of high risk pregnancy, antepartum  3. Multigravida of advanced maternal age in third trimester  4. Diet controlled gestational diabetes mellitus (GDM) in third trimester growth scheduled for 5/22 with bpp.  Recommend qwk ap testing thereafter given over 40 with gdm - Her fasting are all in the upper 90s and so I have prescribed metformin 500 mg qhs She has been checking sugars 5 x per day, so I have told her to always check her fasting, but then she may check 2 hour PCs twice per day. All of her 2 hour PCs have been quite low. - start BPP next week with cervical  cultures  Preterm labor symptoms and general obstetric precautions including but not limited to vaginal bleeding, contractions, leaking of fluid and fetal movement were reviewed in detail with the patient. Please refer to After Visit Summary for other counseling recommendations.   No follow-ups on file.  Future Appointments  Date Time Provider Department Center  04/25/2019 11:00 AM WH-MFC NURSE [facility] MFC-US  04/25/2019 11:00 AM WH-MFC [place] 3 WH-MFCUS MFC-US    Allie Bossier, MD

## 2019-04-25 ENCOUNTER — Other Ambulatory Visit: Payer: Self-pay

## 2019-04-25 ENCOUNTER — Ambulatory Visit (HOSPITAL_COMMUNITY): Payer: Self-pay | Admitting: *Deleted

## 2019-04-25 ENCOUNTER — Ambulatory Visit (HOSPITAL_COMMUNITY)
Admission: RE | Admit: 2019-04-25 | Discharge: 2019-04-25 | Disposition: A | Discharge status: Home / Self Care | Payer: Self-pay | Source: Ambulatory Visit | Attending: Obstetrics and Gynecology | Admitting: Obstetrics and Gynecology

## 2019-04-25 VITALS — BP 143/86 | HR 60 | Temp 97.9°F

## 2019-04-25 DIAGNOSIS — I10 Essential (primary) hypertension: Secondary | ICD-10-CM

## 2019-04-25 DIAGNOSIS — O24419 Gestational diabetes mellitus in pregnancy, unspecified control: Secondary | ICD-10-CM

## 2019-04-25 DIAGNOSIS — O24410 Gestational diabetes mellitus in pregnancy, diet controlled: Secondary | ICD-10-CM | POA: Insufficient documentation

## 2019-04-25 DIAGNOSIS — Z362 Encounter for other antenatal screening follow-up: Secondary | ICD-10-CM

## 2019-04-25 DIAGNOSIS — O09522 Supervision of elderly multigravida, second trimester: Secondary | ICD-10-CM

## 2019-04-25 DIAGNOSIS — O09523 Supervision of elderly multigravida, third trimester: Secondary | ICD-10-CM

## 2019-04-25 DIAGNOSIS — Z3A36 36 weeks gestation of pregnancy: Secondary | ICD-10-CM

## 2019-04-26 ENCOUNTER — Other Ambulatory Visit: Payer: Self-pay

## 2019-04-26 ENCOUNTER — Ambulatory Visit (INDEPENDENT_AMBULATORY_CARE_PROVIDER_SITE_OTHER): Payer: Self-pay | Admitting: Obstetrics and Gynecology

## 2019-04-26 ENCOUNTER — Encounter: Payer: Self-pay | Admitting: Obstetrics and Gynecology

## 2019-04-26 ENCOUNTER — Other Ambulatory Visit (HOSPITAL_COMMUNITY): Payer: Self-pay | Admitting: Maternal & Fetal Medicine

## 2019-04-26 VITALS — BP 137/85 | HR 77 | Temp 98.0°F | Wt 150.1 lb

## 2019-04-26 DIAGNOSIS — N898 Other specified noninflammatory disorders of vagina: Secondary | ICD-10-CM

## 2019-04-26 DIAGNOSIS — O09523 Supervision of elderly multigravida, third trimester: Secondary | ICD-10-CM

## 2019-04-26 DIAGNOSIS — O0990 Supervision of high risk pregnancy, unspecified, unspecified trimester: Secondary | ICD-10-CM

## 2019-04-26 DIAGNOSIS — Z3A39 39 weeks gestation of pregnancy: Secondary | ICD-10-CM

## 2019-04-26 DIAGNOSIS — Z113 Encounter for screening for infections with a predominantly sexual mode of transmission: Secondary | ICD-10-CM

## 2019-04-26 DIAGNOSIS — O24410 Gestational diabetes mellitus in pregnancy, diet controlled: Secondary | ICD-10-CM

## 2019-04-26 DIAGNOSIS — Z3A40 40 weeks gestation of pregnancy: Secondary | ICD-10-CM

## 2019-04-26 DIAGNOSIS — O139 Gestational [pregnancy-induced] hypertension without significant proteinuria, unspecified trimester: Secondary | ICD-10-CM | POA: Insufficient documentation

## 2019-04-26 DIAGNOSIS — O133 Gestational [pregnancy-induced] hypertension without significant proteinuria, third trimester: Secondary | ICD-10-CM

## 2019-04-26 DIAGNOSIS — Z3A36 36 weeks gestation of pregnancy: Secondary | ICD-10-CM

## 2019-04-26 DIAGNOSIS — Z789 Other specified health status: Secondary | ICD-10-CM

## 2019-04-26 DIAGNOSIS — Z3A37 37 weeks gestation of pregnancy: Secondary | ICD-10-CM

## 2019-04-26 DIAGNOSIS — O0993 Supervision of high risk pregnancy, unspecified, third trimester: Secondary | ICD-10-CM

## 2019-04-26 DIAGNOSIS — Z3A38 38 weeks gestation of pregnancy: Secondary | ICD-10-CM

## 2019-04-26 LAB — CBC
Hematocrit: 39.3 % (ref 34.0–46.6)
Hemoglobin: 13 g/dL (ref 11.1–15.9)
MCH: 27.2 pg (ref 26.6–33.0)
MCHC: 33.1 g/dL (ref 31.5–35.7)
MCV: 82 fL (ref 79–97)
Platelets: 161 10*3/uL (ref 150–450)
RBC: 4.78 x10E6/uL (ref 3.77–5.28)
RDW: 14.5 % (ref 11.7–15.4)
WBC: 7.8 10*3/uL (ref 3.4–10.8)

## 2019-04-26 LAB — COMPREHENSIVE METABOLIC PANEL
ALT: 44 IU/L — ABNORMAL HIGH (ref 0–32)
AST: 39 IU/L (ref 0–40)
Albumin/Globulin Ratio: 1.5 (ref 1.2–2.2)
Albumin: 3.9 g/dL (ref 3.8–4.8)
Alkaline Phosphatase: 238 IU/L — ABNORMAL HIGH (ref 39–117)
BUN/Creatinine Ratio: 18 (ref 9–23)
BUN: 15 mg/dL (ref 6–24)
Bilirubin Total: 0.4 mg/dL (ref 0.0–1.2)
CO2: 20 mmol/L (ref 20–29)
Calcium: 9.9 mg/dL (ref 8.7–10.2)
Chloride: 102 mmol/L (ref 96–106)
Creatinine, Ser: 0.85 mg/dL (ref 0.57–1.00)
GFR calc Af Amer: 98 mL/min/{1.73_m2} (ref >59)
GFR calc non Af Amer: 85 mL/min/{1.73_m2} (ref >59)
Globulin, Total: 2.6 g/dL (ref 1.5–4.5)
Glucose: 61 mg/dL — ABNORMAL LOW (ref 65–99)
Potassium: 4.1 mmol/L (ref 3.5–5.2)
Sodium: 137 mmol/L (ref 134–144)
Total Protein: 6.5 g/dL (ref 6.0–8.5)

## 2019-04-26 LAB — OB RESULTS CONSOLE GBS: GBS: NEGATIVE

## 2019-04-26 LAB — OB RESULTS CONSOLE GC/CHLAMYDIA: Gonorrhea: NEGATIVE

## 2019-04-26 LAB — PROTEIN / CREATININE RATIO, URINE
Creatinine, Urine: 61.7 mg/dL
Protein, Ur: 29.3 mg/dL
Protein/Creat Ratio: 475 mg/g creat — ABNORMAL HIGH (ref 0–200)

## 2019-04-26 LAB — TSH: TSH: 3.35 u[IU]/mL (ref 0.450–4.500)

## 2019-04-26 MED ORDER — METFORMIN HCL 500 MG PO TABS: ORAL_TABLET | ORAL | 3 refills | Status: DC

## 2019-04-26 NOTE — Progress Notes (Signed)
Pt states has been having some vaginal irritation.Pt has paper logs.

## 2019-04-26 NOTE — Progress Notes (Signed)
Prenatal Visit Note Date: 04/26/2019 Clinic: [facility]  Subjective:  Heidi Guzman is a 43 y.o. [phone] at [redacted]w[redacted]d being seen today for ongoing prenatal care.  She is currently monitored for the following issues for this high-risk pregnancy and has Supervision of high risk pregnancy, antepartum; AMA (advanced maternal age) multigravida 35+; Gestational diabetes; Language barrier; and Gestational hypertension on their problem list.  Patient reports no complaints.   Contractions: Not present. Vag. Bleeding: None.  Movement: Present. Denies leaking of fluid.   The following portions of the patient's history were reviewed and updated as appropriate: allergies, current medications, past family history, past medical history, past social history, past surgical history and problem list. Problem list updated.  Objective:   Vitals:   04/26/19 0841 04/26/19 0847  BP: (!) 143/83 137/85  Pulse: 71 77  Temp: 98 F (36.7 C)   Weight: 150 lb 1.6 oz (68.1 kg)     Fetal Status: Fetal Heart Rate (bpm): 125   Movement: Present  Presentation: Vertex  General:  Alert, oriented and cooperative. Patient is in no acute distress.  Skin: Skin is warm and dry. No rash noted.   Cardiovascular: Normal heart rate noted  Respiratory: Normal respiratory effort, no problems with respiration noted  Abdomen: Soft, gravid, appropriate for gestational age. Pain/Pressure: Absent     Pelvic:  Cervical exam deferred Dilation: 1 Effacement (%): Thick Station: Ballotable  Extremities: Normal range of motion.  Edema: None  Mental Status: Normal mood and affect. Normal behavior. Normal judgment and thought content.   Urinalysis:      Assessment and Plan:  Pregnancy: G4P3003 at [redacted]w[redacted]d  1. Supervision of high risk pregnancy, antepartum Routine care - Culture, beta strep (group b only) - Cervicovaginal ancillary only( [place])  2. Discharge from the vagina - Cervicovaginal ancillary only(  [place])  3. Gestational hypertension, third trimester No s/s of pre-x. Precautions given will set up for 37wk iol given this. Labs neg yesterday (slightly up LFT)  4. Language barrier Interpreter used  5. Multigravida of advanced maternal age in third trimester  6. GDMa2 Increase metformin from 500 qhs to 500/1000. bpp 8/8 yesterday  Preterm labor symptoms and general obstetric precautions including but not limited to vaginal bleeding, contractions, leaking of fluid and fetal movement were reviewed in detail with the patient. Please refer to After Visit Summary for other counseling recommendations.  Return in about 3 weeks (around 05/17/2019) for bp check. cancel all future mfm, ultrasound visits.   [place] Bing, MD

## 2019-04-27 LAB — CERVICOVAGINAL ANCILLARY ONLY
Bacterial vaginitis: NEGATIVE
Candida vaginitis: NEGATIVE
Chlamydia: NEGATIVE
Neisseria Gonorrhea: NEGATIVE
Trichomonas: NEGATIVE

## 2019-04-30 LAB — CULTURE, BETA STREP (GROUP B ONLY): Strep Gp B Culture: NEGATIVE

## 2019-05-01 ENCOUNTER — Encounter (HOSPITAL_COMMUNITY): Payer: Self-pay

## 2019-05-01 ENCOUNTER — Inpatient Hospital Stay (HOSPITAL_COMMUNITY)
Admission: AD | Admit: 2019-05-01 | Discharge: 2019-05-03 | DRG: 807 | Disposition: A | Discharge status: Home / Self Care | Payer: Medicaid Other | Attending: Family Medicine | Admitting: Family Medicine

## 2019-05-01 ENCOUNTER — Other Ambulatory Visit: Payer: Self-pay | Admitting: Obstetrics and Gynecology

## 2019-05-01 ENCOUNTER — Ambulatory Visit (HOSPITAL_COMMUNITY): Admission: RE | Admit: 2019-05-01 | Payer: Self-pay | Source: Ambulatory Visit

## 2019-05-01 ENCOUNTER — Ambulatory Visit (HOSPITAL_COMMUNITY): Payer: Self-pay

## 2019-05-01 DIAGNOSIS — Z3A37 37 weeks gestation of pregnancy: Secondary | ICD-10-CM | POA: Diagnosis not present

## 2019-05-01 DIAGNOSIS — O24425 Gestational diabetes mellitus in childbirth, controlled by oral hypoglycemic drugs: Secondary | ICD-10-CM | POA: Diagnosis present

## 2019-05-01 DIAGNOSIS — Z1159 Encounter for screening for other viral diseases: Secondary | ICD-10-CM | POA: Diagnosis not present

## 2019-05-01 DIAGNOSIS — O24419 Gestational diabetes mellitus in pregnancy, unspecified control: Secondary | ICD-10-CM | POA: Diagnosis present

## 2019-05-01 DIAGNOSIS — O09529 Supervision of elderly multigravida, unspecified trimester: Secondary | ICD-10-CM

## 2019-05-01 DIAGNOSIS — O1404 Mild to moderate pre-eclampsia, complicating childbirth: Principal | ICD-10-CM | POA: Diagnosis present

## 2019-05-01 DIAGNOSIS — O139 Gestational [pregnancy-induced] hypertension without significant proteinuria, unspecified trimester: Secondary | ICD-10-CM | POA: Diagnosis present

## 2019-05-01 DIAGNOSIS — O1493 Unspecified pre-eclampsia, third trimester: Secondary | ICD-10-CM | POA: Diagnosis present

## 2019-05-01 DIAGNOSIS — Z789 Other specified health status: Secondary | ICD-10-CM | POA: Diagnosis present

## 2019-05-01 DIAGNOSIS — O0990 Supervision of high risk pregnancy, unspecified, unspecified trimester: Secondary | ICD-10-CM

## 2019-05-01 LAB — COMPREHENSIVE METABOLIC PANEL
ALT: 44 U/L (ref 0–44)
AST: 32 U/L (ref 15–41)
Albumin: 2.6 g/dL — ABNORMAL LOW (ref 3.5–5.0)
Alkaline Phosphatase: 233 U/L — ABNORMAL HIGH (ref 38–126)
Anion gap: 11 (ref 5–15)
BUN: 14 mg/dL (ref 6–20)
CO2: 19 mmol/L — ABNORMAL LOW (ref 22–32)
Calcium: 9.3 mg/dL (ref 8.9–10.3)
Chloride: 107 mmol/L (ref 98–111)
Creatinine, Ser: 0.88 mg/dL (ref 0.44–1.00)
GFR calc Af Amer: >60 mL/min (ref >60)
GFR calc non Af Amer: >60 mL/min (ref >60)
Glucose, Bld: 75 mg/dL (ref 70–99)
Potassium: 3.9 mmol/L (ref 3.5–5.1)
Sodium: 137 mmol/L (ref 135–145)
Total Bilirubin: 0.5 mg/dL (ref 0.3–1.2)
Total Protein: 6.2 g/dL — ABNORMAL LOW (ref 6.5–8.1)

## 2019-05-01 LAB — GLUCOSE, CAPILLARY
Glucose-Capillary: 78 mg/dL (ref 70–99)
Glucose-Capillary: 100 mg/dL — ABNORMAL HIGH (ref 70–99)
Glucose-Capillary: 74 mg/dL (ref 70–99)
Glucose-Capillary: 97 mg/dL (ref 70–99)

## 2019-05-01 LAB — TYPE AND SCREEN
ABO/RH(D): O POS
Antibody Screen: NEGATIVE

## 2019-05-01 LAB — CBC
HCT: 39.7 % (ref 36.0–46.0)
Hemoglobin: 12.8 g/dL (ref 12.0–15.0)
MCH: 27.1 pg (ref 26.0–34.0)
MCHC: 32.2 g/dL (ref 30.0–36.0)
MCV: 83.9 fL (ref 80.0–100.0)
Platelets: 166 10*3/uL (ref 150–400)
RBC: 4.73 MIL/uL (ref 3.87–5.11)
RDW: 14.9 % (ref 11.5–15.5)
WBC: 7.2 10*3/uL (ref 4.0–10.5)
nRBC: 0 % (ref 0.0–0.2)

## 2019-05-01 LAB — PROTEIN / CREATININE RATIO, URINE
Creatinine, Urine: 117.65 mg/dL
Protein Creatinine Ratio: 0.38 mg/mg{Cre} — ABNORMAL HIGH (ref 0.00–0.15)
Total Protein, Urine: 45 mg/dL

## 2019-05-01 LAB — SARS CORONAVIRUS 2 BY RT PCR (HOSPITAL ORDER, PERFORMED IN ~~LOC~~ HOSPITAL LAB): SARS Coronavirus 2: NEGATIVE

## 2019-05-01 LAB — ABO/RH: ABO/RH(D): O POS

## 2019-05-01 LAB — RPR: RPR Ser Ql: NONREACTIVE

## 2019-05-01 MED ORDER — ONDANSETRON HCL 4 MG/2ML IJ SOLN
4.0000 mg | Freq: Four times a day (QID) | INTRAMUSCULAR | Status: DC | PRN
  Administered 2019-05-01: 4 mg via INTRAVENOUS
  Filled 2019-05-01: qty 2

## 2019-05-01 MED ORDER — LIDOCAINE HCL (PF) 1 % IJ SOLN: 30.0000 mL | INTRAMUSCULAR | Status: DC | PRN

## 2019-05-01 MED ORDER — ZOLPIDEM TARTRATE 5 MG PO TABS: 5.0000 mg | ORAL_TABLET | Freq: Every evening | ORAL | Status: DC | PRN

## 2019-05-01 MED ORDER — COCONUT OIL OIL: 1.0000 "application " | TOPICAL_OIL | Status: DC | PRN

## 2019-05-01 MED ORDER — LABETALOL HCL 5 MG/ML IV SOLN: 40.0000 mg | INTRAVENOUS | Status: DC | PRN

## 2019-05-01 MED ORDER — TETANUS-DIPHTH-ACELL PERTUSSIS 5-2.5-18.5 LF-MCG/0.5 IM SUSP: 0.5000 mL | Freq: Once | INTRAMUSCULAR | Status: DC

## 2019-05-01 MED ORDER — BENZOCAINE-MENTHOL 20-0.5 % EX AERO: 1.0000 "application " | INHALATION_SPRAY | CUTANEOUS | Status: DC | PRN

## 2019-05-01 MED ORDER — DIBUCAINE (PERIANAL) 1 % EX OINT: 1.0000 "application " | TOPICAL_OINTMENT | CUTANEOUS | Status: DC | PRN

## 2019-05-01 MED ORDER — OXYTOCIN 40 UNITS IN NORMAL SALINE INFUSION - SIMPLE MED
2.5000 [IU]/h | INTRAVENOUS | Status: DC
  Administered 2019-05-01: 2.5 [IU]/h via INTRAVENOUS
  Filled 2019-05-01: qty 1000

## 2019-05-01 MED ORDER — PRENATAL MULTIVITAMIN CH
1.0000 | ORAL_TABLET | Freq: Every day | ORAL | Status: DC
  Administered 2019-05-02 – 2019-05-03 (×2): 1 via ORAL
  Filled 2019-05-01 (×2): qty 1

## 2019-05-01 MED ORDER — SENNOSIDES-DOCUSATE SODIUM 8.6-50 MG PO TABS
2.0000 | ORAL_TABLET | ORAL | Status: DC
  Administered 2019-05-01 – 2019-05-03 (×3): 2 via ORAL
  Filled 2019-05-01 (×3): qty 2

## 2019-05-01 MED ORDER — DIPHENHYDRAMINE HCL 25 MG PO CAPS: 25.0000 mg | ORAL_CAPSULE | Freq: Four times a day (QID) | ORAL | Status: DC | PRN

## 2019-05-01 MED ORDER — MEASLES, MUMPS & RUBELLA VAC IJ SOLR: 0.5000 mL | Freq: Once | INTRAMUSCULAR | Status: DC

## 2019-05-01 MED ORDER — HYDRALAZINE HCL 20 MG/ML IJ SOLN
5.0000 mg | INTRAMUSCULAR | Status: DC | PRN
  Administered 2019-05-01: 5 mg via INTRAVENOUS
  Filled 2019-05-01: qty 1

## 2019-05-01 MED ORDER — SIMETHICONE 80 MG PO CHEW: 80.0000 mg | CHEWABLE_TABLET | ORAL | Status: DC | PRN

## 2019-05-01 MED ORDER — ACETAMINOPHEN 325 MG PO TABS
650.0000 mg | ORAL_TABLET | ORAL | Status: DC | PRN
  Administered 2019-05-01: 650 mg via ORAL
  Filled 2019-05-01: qty 2

## 2019-05-01 MED ORDER — METFORMIN HCL 500 MG PO TABS
1000.0000 mg | ORAL_TABLET | Freq: Every day | ORAL | Status: DC
  Filled 2019-05-01 (×2): qty 2

## 2019-05-01 MED ORDER — LACTATED RINGERS IV SOLN
INTRAVENOUS | Status: DC
  Administered 2019-05-01 (×2): via INTRAVENOUS

## 2019-05-01 MED ORDER — TERBUTALINE SULFATE 1 MG/ML IJ SOLN: 0.2500 mg | Freq: Once | INTRAMUSCULAR | Status: DC | PRN

## 2019-05-01 MED ORDER — OXYTOCIN 40 UNITS IN NORMAL SALINE INFUSION - SIMPLE MED
1.0000 m[IU]/min | INTRAVENOUS | Status: DC
  Administered 2019-05-01: 2 m[IU]/min via INTRAVENOUS

## 2019-05-01 MED ORDER — ONDANSETRON HCL 4 MG/2ML IJ SOLN: 4.0000 mg | INTRAMUSCULAR | Status: DC | PRN

## 2019-05-01 MED ORDER — IBUPROFEN 600 MG PO TABS
600.0000 mg | ORAL_TABLET | Freq: Four times a day (QID) | ORAL | Status: DC
  Administered 2019-05-01 – 2019-05-03 (×9): 600 mg via ORAL
  Filled 2019-05-01 (×9): qty 1

## 2019-05-01 MED ORDER — FENTANYL CITRATE (PF) 100 MCG/2ML IJ SOLN
50.0000 ug | Freq: Once | INTRAMUSCULAR | Status: AC
  Administered 2019-05-01: 50 ug via INTRAVENOUS
  Filled 2019-05-01: qty 2

## 2019-05-01 MED ORDER — METFORMIN HCL 500 MG PO TABS
1000.0000 mg | ORAL_TABLET | Freq: Two times a day (BID) | ORAL | Status: DC
  Filled 2019-05-01 (×2): qty 2

## 2019-05-01 MED ORDER — METFORMIN HCL 500 MG PO TABS
500.0000 mg | ORAL_TABLET | Freq: Every day | ORAL | Status: DC
  Administered 2019-05-01: 500 mg via ORAL
  Filled 2019-05-01 (×2): qty 1

## 2019-05-01 MED ORDER — ONDANSETRON HCL 4 MG PO TABS: 4.0000 mg | ORAL_TABLET | ORAL | Status: DC | PRN

## 2019-05-01 MED ORDER — OXYCODONE-ACETAMINOPHEN 5-325 MG PO TABS: 1.0000 | ORAL_TABLET | ORAL | Status: DC | PRN

## 2019-05-01 MED ORDER — MISOPROSTOL 50MCG HALF TABLET
50.0000 ug | ORAL_TABLET | ORAL | Status: DC | PRN
  Administered 2019-05-01: 09:00:00 50 ug via BUCCAL
  Filled 2019-05-01 (×2): qty 1

## 2019-05-01 MED ORDER — LABETALOL HCL 5 MG/ML IV SOLN: 20.0000 mg | INTRAVENOUS | Status: DC | PRN

## 2019-05-01 MED ORDER — HYDRALAZINE HCL 20 MG/ML IJ SOLN: 10.0000 mg | INTRAMUSCULAR | Status: DC | PRN

## 2019-05-01 MED ORDER — SOD CITRATE-CITRIC ACID 500-334 MG/5ML PO SOLN: 30.0000 mL | ORAL | Status: DC | PRN

## 2019-05-01 MED ORDER — LACTATED RINGERS IV SOLN: 500.0000 mL | INTRAVENOUS | Status: DC | PRN

## 2019-05-01 MED ORDER — OXYCODONE-ACETAMINOPHEN 5-325 MG PO TABS: 2.0000 | ORAL_TABLET | ORAL | Status: DC | PRN

## 2019-05-01 MED ORDER — OXYTOCIN BOLUS FROM INFUSION
500.0000 mL | Freq: Once | INTRAVENOUS | Status: AC
  Administered 2019-05-01: 500 mL via INTRAVENOUS

## 2019-05-01 MED ORDER — WITCH HAZEL-GLYCERIN EX PADS: 1.0000 "application " | MEDICATED_PAD | CUTANEOUS | Status: DC | PRN

## 2019-05-01 MED ORDER — METFORMIN HCL 500 MG PO TABS: 500.0000 mg | ORAL_TABLET | Freq: Every day | ORAL | Status: DC

## 2019-05-01 MED ORDER — ACETAMINOPHEN 325 MG PO TABS: 650.0000 mg | ORAL_TABLET | ORAL | Status: DC | PRN

## 2019-05-01 NOTE — H&P (Signed)
LABOR AND DELIVERY ADMISSION HISTORY AND PHYSICAL NOTE  Heidi Guzman is a 43 y.o. female [phone] with IUP at 1439w0d by 4919 week sono presenting for IOL for Pre-E. She denies headaches, vision changes, RUQ pain.  She reports positive fetal movement. She denies leakage of fluid or vaginal bleeding.  Prenatal History/Complications: [facility]  Pregnancy complications:  - mild Pre-E, no medications  -A2GDM on Metformin 1000 mg BID, EFW 60% at 36w -AMA   Past Medical History: Past Medical History:  Diagnosis Date  . Gestational diabetes   . Glucose intolerance   . History of Helicobacter pylori infection     Past Surgical History: No past surgical history on file.  Obstetrical History: OB History    Gravida  4   Para  3   Term  3   Preterm      AB      Living  3     SAB      TAB      Ectopic      Multiple      Live Births  3           Social History: Social History   Socioeconomic History  . Marital status: Married    Spouse name: Not on file  . Number of children: Not on file  . Years of education: Not on file  . Highest education level: Not on file  Occupational History  . Not on file  Social Needs  . Financial resource strain: Not on file  . Food insecurity:    Worry: Never true    Inability: Never true  . Transportation needs:    Medical: No    Non-medical: No  Tobacco Use  . Smoking status: Never Smoker  . Smokeless tobacco: Never Used  Substance and Sexual Activity  . Alcohol use: Not Currently  . Drug use: Not Currently  . Sexual activity: Yes    Birth control/protection: None  Lifestyle  . Physical activity:    Days per week: Not on file    Minutes per session: Not on file  . Stress: Not on file  Relationships  . Social connections:    Talks on phone: Not on file    Gets together: Not on file    Attends religious service: Not on file    Active member of club or organization: Not on file    Attends meetings of clubs or  organizations: Not on file    Relationship status: Not on file  Other Topics Concern  . Not on file  Social History Narrative  . Not on file    Family History: Family History  Problem Relation Age of Onset  . Diabetes Mother     Allergies: No Known Allergies  Medications Prior to Admission  Medication Sig Dispense Refill Last Dose  . aspirin EC 81 MG tablet Take 1 tablet (81 mg total) by mouth daily. 100 tablet 2 Taking  . metFORMIN (GLUCOPHAGE) 500 MG tablet 1 tab with breakfast and two tabs before bed 30 tablet 3   . Prenatal Vit-Fe Fumarate-FA (PRENATAL PO) Take by mouth.   Taking     Review of Systems  All systems reviewed and negative except as stated in HPI  Physical Exam Blood pressure (!) 155/85, pulse 63, temperature 98.8 F (37.1 C), temperature source Oral, resp. rate 18, height 5' 1.02" (1.55 m), weight 70.3 kg, last menstrual period 08/06/2018. General appearance: alert, oriented, NAD Lungs: normal respiratory effort Heart:  regular rate Abdomen: soft, non-tender; gravid, FH appropriate for GA Extremities: No calf swelling or tenderness Presentation: cephalic Fetal monitoring: 125 bpm, moderate variability, +acels, no decels  Uterine activity: Irregular q5-9 min  Dilation: 1 Effacement (%): Thick Station: Ballotable Exam by:: Dr. Earlene Plater  Prenatal labs: ABO, Rh: O/Positive/-- (11/11 0000) Antibody: Negative (11/11 0000) Rubella: Immune (11/11 0000) RPR: Non Reactive (04/27 1114)  HBsAg: Negative (11/11 0000)  HIV: Non Reactive (04/27 1114)  GC/Chlamydia: Negative  GBS: Negative (05/21 0000)  2-hr GTT: Abnormal  Genetic screening:  Normal  Anatomy US: Choroid plexus cyst and renal pyelectasis on initial scan, not seen on repeat scan   Prenatal Transfer Tool  Maternal Diabetes: Yes:  Diabetes Type:  Insulin/Medication controlled Genetic Screening: Normal Maternal Ultrasounds/Referrals: Normal Fetal Ultrasounds or other Referrals:  Referred to  Materal Fetal Medicine  Maternal Substance Abuse:  No Significant Maternal Medications:  None Significant Maternal Lab Results: Lab values include: Group B Strep negative  Results for orders placed or performed during the hospital encounter of 05/01/19 (from the past 24 hour(s))  CBC   Collection Time: 05/01/19  8:17 AM  Result Value Ref Range   WBC 7.2 4.0 - 10.5 K/uL   RBC 4.73 3.87 - 5.11 MIL/uL   Hemoglobin 12.8 12.0 - 15.0 g/dL   HCT 12.4 58.0 - 99.8 %   MCV 83.9 80.0 - 100.0 fL   MCH 27.1 26.0 - 34.0 pg   MCHC 32.2 30.0 - 36.0 g/dL   RDW 33.8 25.0 - 53.9 %   Platelets 166 150 - 400 K/uL   nRBC 0.0 0.0 - 0.2 %    Patient Active Problem List   Diagnosis Date Noted  . Preeclampsia, third trimester 05/01/2019  . Gestational hypertension 04/26/2019  . Language barrier 04/02/2019  . Supervision of high risk pregnancy, antepartum 12/07/2018  . AMA (advanced maternal age) multigravida 35+ 12/07/2018  . Gestational diabetes 12/07/2018    Assessment: Heidi Guzman is a 43 y.o. [phone] at [redacted]w[redacted]d here for IOL for Pre-E. Will repeat PIH labs and monitor BPs. Pregnancy also complicated by A2GDM on Metformin. Will check CBGs q4h during latent labor, q2h during active labor.   #Labor: Induction. Cervix not favorable. Start with Cytotec.  #Pain: Undecided about epidural. Has had 3 natural births.  #FWB: Cat I  #ID:  GBS neg  #MOF: Breast  #MOC:IUD  #Circ:  N/A  De Hollingshead 05/01/2019, 8:48 AM

## 2019-05-01 NOTE — Discharge Summary (Signed)
OB Discharge Summary     Patient Name: Heidi Guzman DOB: October 30, 1976 MRN: [id number]  Date of admission: 05/01/2019 Delivering MD: Arvilla MarketWALLACE, CATHERINE LAUREN   Date of discharge: 05/03/2019  Admitting diagnosis: INDUCTION  Intrauterine pregnancy: 532w0d     Secondary diagnosis:  Principal Problem:   Gestational hypertension Active Problems:   AMA (advanced maternal age) multigravida 35+   Gestational diabetes   Language barrier   Preeclampsia, third trimester   SVD (spontaneous vaginal delivery)  Additional problems: PRE E, no medications, A2GDM     Discharge diagnosis: Term Pregnancy Delivered, Preeclampsia (mild) and GDM A2                                                                                                Post partum procedures:None  Augmentation: Pitocin and Cytotec  Complications: None  Hospital course:  Induction of Labor With Vaginal Delivery   43 y.o. yo [phone] at 412w0d was admitted to the hospital 05/01/2019 for induction of labor.  Indication for induction: Preeclampsia.  Patient had an uncomplicated labor course as follows: Membrane Rupture Time/Date: 6:43 PM ,05/01/2019   Intrapartum Procedures: Episiotomy: None [1]                                         Lacerations:  None [1]  Patient had delivery of a Viable infant.  Information for the patient's newborn:  Barnett ApplebaumLopez Guzman, Girl Byrd HesselbachMaria [[id number]][[id number]]  Delivery Method: Vag-Spont   05/01/2019  Details of delivery can be found in separate delivery note.  Patient had a routine postpartum course. Patient is discharged home 05/03/19.  Physical exam  Vitals:   05/02/19 1010 05/02/19 1443 05/02/19 2233 05/03/19 0558  BP: 124/76 129/82 134/82 138/70  Pulse: 63  68 66  Resp: 16 18 18 18   Temp: (!) 97.3 F (36.3 C) 98.3 F (36.8 C) 98.2 F (36.8 C) 97.8 F (36.6 C)  TempSrc: Oral Oral Oral Oral  SpO2:  97%  98%  Weight:      Height:       General: alert, cooperative and no distress Lochia:  appropriate Uterine Fundus: firm Incision: N/A DVT Evaluation: No evidence of DVT seen on physical exam. Labs: Lab Results  Component Value Date   WBC 7.2 05/01/2019   HGB 12.8 05/01/2019   HCT 39.7 05/01/2019   MCV 83.9 05/01/2019   PLT 166 05/01/2019   CMP Latest Ref Rng & Units 05/01/2019  Glucose 70 - 99 mg/dL 75  BUN 6 - 20 mg/dL 14  Creatinine 6.960.44 - 2.951.00 mg/dL 2.840.88  Sodium 132135 - 440145 mmol/L 137  Potassium 3.5 - 5.1 mmol/L 3.9  Chloride 98 - 111 mmol/L 107  CO2 22 - 32 mmol/L 19(L)  Calcium 8.9 - 10.3 mg/dL 9.3  Total Protein 6.5 - 8.1 g/dL 6.2(L)  Total Bilirubin 0.3 - 1.2 mg/dL 0.5  Alkaline Phos 38 - 126 U/L 233(H)  AST 15 - 41 U/L 32  ALT 0 - 44 U/L 44  Discharge instruction: per After Visit Summary and "Baby and Me Booklet".  After visit meds:  Allergies as of 05/03/2019   No Known Allergies     Medication List    STOP taking these medications   metFORMIN 500 MG tablet Commonly known as:  GLUCOPHAGE     TAKE these medications   aspirin EC 81 MG tablet Take 1 tablet (81 mg total) by mouth daily.   PRENATAL PO Take by mouth.       Diet: carb modified diet  Activity: Advance as tolerated. Pelvic rest for 6 weeks.   Outpatient follow up:4 weeks Follow up Appt: Future Appointments  Date Time Provider Department Center  05/17/2019  2:00 PM [facility] NURSE [facility]   Follow up Visit:No follow-ups on file.  Postpartum contraception: IUD Mirena, to be placed PP   Newborn Data: Live born female  Birth Weight:   APGAR: 9, 9  Newborn Delivery   Birth date/time:  05/01/2019 19:09:00 Delivery type:  Vaginal, Spontaneous     Baby Feeding: Breast Disposition:home with mother   05/03/2019 Venia Carbon, NP

## 2019-05-01 NOTE — Progress Notes (Signed)
LABOR PROGRESS NOTE  Heidi Guzman is a 43 y.o. [phone] at [redacted]w[redacted]d  admitted for IOL for Pre-E.   Subjective: Strip note. Discussed plan of care with RN.   Objective: BP (!) 157/83   Pulse (!) 58   Temp 98.7 F (37.1 C) (Oral)   Resp 16   Ht 5' 1.02" (1.55 m)   Wt 70.3 kg   LMP 08/06/2018 (Approximate)   BMI 29.26 kg/m  or  Vitals:   05/01/19 1015 05/01/19 1101 05/01/19 1202 05/01/19 1403  BP: (!) 146/79 (!) 157/81 (!) 146/80 (!) 157/83  Pulse: 60 (!) 59 60 (!) 58  Resp: 18 18 17 16   Temp:    98.7 F (37.1 C)  TempSrc:    Oral  Weight:      Height:        Dilation: 3 Effacement (%): 70 Cervical Position: Posterior Station: -2 Presentation: Vertex Exam by:: stone rnc FHT: baseline rate 130, moderate varibility, +acel, early decel Toco: q2-3 min   Labs: Lab Results  Component Value Date   WBC 7.2 05/01/2019   HGB 12.8 05/01/2019   HCT 39.7 05/01/2019   MCV 83.9 05/01/2019   PLT 166 05/01/2019    Patient Active Problem List   Diagnosis Date Noted  . Preeclampsia, third trimester 05/01/2019  . Gestational hypertension 04/26/2019  . Language barrier 04/02/2019  . Supervision of high risk pregnancy, antepartum 12/07/2018  . AMA (advanced maternal age) multigravida 35+ 12/07/2018  . Gestational diabetes 12/07/2018    Assessment / Plan: 43 y.o. G4P3003 at [redacted]w[redacted]d here for IOL for Pre-E. No severe range pressures.   Labor: Induction. S/p cytotec x1. Now with favorable cervix. Will start Pitocin 2x2.  Fetal Wellbeing:  Cat I  Pain Control:  Epidural upon maternal request if decides she would like one  Anticipated MOD:  NSVD   Marcy Siren, D.O. OB Fellow  05/01/2019, 2:35 PM

## 2019-05-02 ENCOUNTER — Other Ambulatory Visit: Payer: Self-pay

## 2019-05-02 ENCOUNTER — Encounter (HOSPITAL_COMMUNITY): Payer: Self-pay | Admitting: *Deleted

## 2019-05-02 LAB — GLUCOSE, CAPILLARY: Glucose-Capillary: 73 mg/dL (ref 70–99)

## 2019-05-02 NOTE — Lactation Note (Addendum)
This note was copied from a baby's chart. Lactation Consultation Note  Patient Name: Heidi Guzman WIOMB'T Date: 05/02/2019 Reason for consult: Initial assessment;Early term 37-38.6wks P4, 1 hour female infant. Infant had 3 voids since delivery. Spanish interpeter used: Dois Davenport [phone] Per mom, she breastfeed her last two children for 6 months and 4 months. Per mom, she had given infant formula less than 2 hours prior to [facility] entering the room. Infant was not cuing to breastfeed at this time, LC did not observe a latch. Infant had large volume of formula (75 ml) at 4:30 am. LC discussed small tummy size of infant.  Mom is active on the [facility] program in [place].  Per mom, her current feeding choice is breast and formula feeding. Per mom, she did not think she had any milk in her  breast.  LC discussed hand expression and mom taught back seeing colostrum present in breast. Per mom, she will start latching infant to breast first and then supplement with formula. LC discussed how to use DEBP and mom will pump every 3 hours on initial setting for 15 minutes. Mom shown how to use DEBP & how to disassemble, clean, & reassemble parts. Mom knows to breastfeed infant according hunger cues, 8-12 times within 24 hours and breastfeed on demand. Mom knows to ask Nurse or LC if she has any questions, concerns or need assistance with latching infant to breast.  LC discussed I& O. Reviewed Baby & Me book's Breastfeeding Basics.  Mom made aware of O/P services, breastfeeding support groups, community resources, and our phone # for post-discharge questions.  Maternal Data Formula Feeding for Exclusion: No Has patient been taught Hand Expression?: Yes(Mom has colostrum present.) Does the patient have breastfeeding experience prior to this delivery?: Yes  Feeding Feeding Type: Breast Fed Nipple Type: Slow - flow  LATCH Score                   Interventions Interventions: Breast  feeding basics reviewed;Skin to skin;Hand express;DEBP  Lactation Tools Discussed/Used Pump Review: Setup, frequency, and cleaning Initiated by:: Danelle Earthly, IBCLC Date initiated:: 05/02/19   Consult Status Consult Status: Follow-up Date: 05/02/19 Follow-up type: In-patient    Danelle Earthly 05/02/2019, 6:55 AM

## 2019-05-02 NOTE — Lactation Note (Signed)
This note was copied from a baby's chart. Lactation Consultation Note  Patient Name: Heidi Guzman GNFAO'Z Date: 05/02/2019   P4, 6 hour female infant. LC entered the room, mom and infant asleep.   Maternal Data    Feeding Feeding Type: Bottle Fed - Formula  LATCH Score                   Interventions    Lactation Tools Discussed/Used     Consult Status      Danelle Earthly 05/02/2019, 1:42 AM

## 2019-05-02 NOTE — Progress Notes (Signed)
Post Partum Day 1 Subjective: no complaints, up ad lib, voiding and tolerating PO  No acute events overnight. Doing well today. Breastfeeding is improving.   Objective: Blood pressure 124/76, pulse 63, temperature (!) 97.3 F (36.3 C), temperature source Oral, resp. rate 16, height 5' 1.02" (1.55 m), weight 70.3 kg, last menstrual period 08/06/2018, SpO2 98 %, unknown if currently breastfeeding.  Physical Exam:  General: alert and cooperative Lochia: appropriate Uterine Fundus: firm Incision: N/A DVT Evaluation: No evidence of DVT seen on physical exam. No cords or calf tenderness. No significant calf/ankle edema.  Recent Labs    05/01/19 0817  HGB 12.8  HCT 39.7    Assessment/Plan: Plan for discharge tomorrow, Breastfeeding and Contraception planning IUD at [facility] visit   LOS: 1 day   Vonzella Nipple, PA-C 05/02/2019, 10:33 AM

## 2019-05-02 NOTE — Progress Notes (Signed)
CSW received consult due to score 14 on Edinburgh Depression Screen.     CSW met with MOB at bedside to discuss further concerns. Upon entering the room CSW congratulated MOB on the birth of infant as well as introduced role to MOB. CSW advised MOB of the reason for the visit. MOB reported that her oldest daughter was admitted to a psych hospital earlier on this year and she answered the questions based upon that encounter. MOB reported that daughter was discharged from the psych hospital in February 2020, however MOB reports that she has been taking sole care of her as she is not "medicated". CSW asked MOB if she felt safe in the home with older daughter as MOB reported that "she gets upset and flies off at times". CSW understanding and asked MOB if she needed any other resources to care for infant. MOB reported that she has everything needed, however doesn't have WIC set up yet. CSW advised MOB that MOB has to get that set up in order to ensure that infant will get food. CSW advised MOB that if MOB has concerns about getting infant food, CSW would need to make a CPS report if MOB is unable to meet this need. MOB expressed that she would call and get it set up soon. CSW understanding of this.   MOB reported that she doesn't have a bed for infant yet. CSW provided MOB with Baby Box at this time for infant to sleep in. MOB reported that she is currently feeling okay but still dealing with her daughter. CSW offered MOB outpatient mental health services and MOB declined stating "im okay".   CSW received verbal permission from MOB to send over CC4C referral for infant and MOB at this time.   CSW provided education regarding Baby Blues vs PMADs and provided MOB with resources for mental health follow up.  CSW encouraged MOB to evaluate her mental health throughout the postpartum period with the use of the New Mom Checklist developed by Postpartum Progress as well as the Edinburgh Postnatal Depression Scale and  notify a medical professional if symptoms arise.       Hiedi Touchton S. Nikodem Leadbetter, MSW, LCSW-A [facility] [place] [phone]  

## 2019-05-03 DIAGNOSIS — O24425 Gestational diabetes mellitus in childbirth, controlled by oral hypoglycemic drugs: Secondary | ICD-10-CM

## 2019-05-03 DIAGNOSIS — Z3A37 37 weeks gestation of pregnancy: Secondary | ICD-10-CM

## 2019-05-03 DIAGNOSIS — O1404 Mild to moderate pre-eclampsia, complicating childbirth: Secondary | ICD-10-CM

## 2019-05-03 NOTE — Discharge Instructions (Signed)
Vaginal Delivery, Care After °Refer to this sheet in the next few weeks. These instructions provide you with information about caring for yourself after vaginal delivery. Your health care provider may also give you more specific instructions. Your treatment has been planned according to current medical practices, but problems sometimes occur. Call your health care provider if you have any problems or questions. °What can I expect after the procedure? °After vaginal delivery, it is common to have: °· Some bleeding from your vagina. °· Soreness in your abdomen, your vagina, and the area of skin between your vaginal opening and your anus (perineum). °· Pelvic cramps. °· Fatigue. °Follow these instructions at home: °Medicines °· Take over-the-counter and prescription medicines only as told by your health care provider. °· If you were prescribed an antibiotic medicine, take it as told by your health care provider. Do not stop taking the antibiotic until it is finished. °Driving ° °· Do not drive or operate heavy machinery while taking prescription pain medicine. °· Do not drive for 24 hours if you received a sedative. °Lifestyle °· Do not drink alcohol. This is especially important if you are breastfeeding or taking medicine to relieve pain. °· Do not use tobacco products, including cigarettes, chewing tobacco, or e-cigarettes. If you need help quitting, ask your health care provider. °Eating and drinking °· Drink at least 8 eight-ounce glasses of water every day unless you are told not to by your health care provider. If you choose to breastfeed your baby, you may need to drink more water than this. °· Eat high-fiber foods every day. These foods may help prevent or relieve constipation. High-fiber foods include: °? Whole grain cereals and breads. °? Brown rice. °? Beans. °? Fresh fruits and vegetables. °Activity °· Return to your normal activities as told by your health care provider. Ask your health care provider what  activities are safe for you. °· Rest as much as possible. Try to rest or take a nap when your baby is sleeping. °· Do not lift anything that is heavier than your baby or 10 lb (4.5 kg) until your health care provider says that it is safe. °· Talk with your health care provider about when you can engage in sexual activity. This may depend on your: °? Risk of infection. °? Rate of healing. °? Comfort and desire to engage in sexual activity. °Vaginal Care °· If you have an episiotomy or a vaginal tear, check the area every day for signs of infection. Check for: °? More redness, swelling, or pain. °? More fluid or blood. °? Warmth. °? Pus or a bad smell. °· Do not use tampons or douches until your health care provider says this is safe. °· Watch for any blood clots that may pass from your vagina. These may look like clumps of dark red, brown, or black discharge. °General instructions °· Keep your perineum clean and dry as told by your health care provider. °· Wear loose, comfortable clothing. °· Wipe from front to back when you use the toilet. °· Ask your health care provider if you can shower or take a bath. If you had an episiotomy or a perineal tear during labor and delivery, your health care provider may tell you not to take baths for a certain length of time. °· Wear a bra that supports your breasts and fits you well. °· If possible, have someone help you with household activities and help care for your baby for at least a few days after you   leave the hospital. °· Keep all follow-up visits for you and your baby as told by your health care provider. This is important. °Contact a health care provider if: °· You have: °? Vaginal discharge that has a bad smell. °? Difficulty urinating. °? Pain when urinating. °? A sudden increase or decrease in the frequency of your bowel movements. °? More redness, swelling, or pain around your episiotomy or vaginal tear. °? More fluid or blood coming from your episiotomy or vaginal  tear. °? Pus or a bad smell coming from your episiotomy or vaginal tear. °? A fever. °? A rash. °? Little or no interest in activities you used to enjoy. °? Questions about caring for yourself or your baby. °· Your episiotomy or vaginal tear feels warm to the touch. °· Your episiotomy or vaginal tear is separating or does not appear to be healing. °· Your breasts are painful, hard, or turn red. °· You feel unusually sad or worried. °· You feel nauseous or you vomit. °· You pass large blood clots from your vagina. If you pass a blood clot from your vagina, save it to show to your health care provider. Do not flush blood clots down the toilet without having your health care provider look at them. °· You urinate more than usual. °· You are dizzy or light-headed. °· You have not breastfed at all and you have not had a menstrual period for 12 weeks after delivery. °· You have stopped breastfeeding and you have not had a menstrual period for 12 weeks after you stopped breastfeeding. °Get help right away if: °· You have: °? Pain that does not go away or does not get better with medicine. °? Chest pain. °? Difficulty breathing. °? Blurred vision or spots in your vision. °? Thoughts about hurting yourself or your baby. °· You develop pain in your abdomen or in one of your legs. °· You develop a severe headache. °· You faint. °· You bleed from your vagina so much that you fill two sanitary pads in one hour. °This information is not intended to replace advice given to you by your health care provider. Make sure you discuss any questions you have with your health care provider. °Document Released: 11/19/2000 Document Revised: 05/05/2016 Document Reviewed: 12/07/2015 °Elsevier Interactive Patient Education © 2019 Elsevier Inc. ° °

## 2019-05-03 NOTE — Progress Notes (Signed)
Ordered pt meals, and ck pt needs by Orlan Leavens Spanish Medical Interpreter.

## 2019-05-04 ENCOUNTER — Ambulatory Visit: Payer: Self-pay

## 2019-05-04 NOTE — Lactation Note (Addendum)
This note was copied from a baby's chart. Lactation Consultation Note  Patient Name: Heidi Guzman MIWOE'H Date: 05/04/2019   Mom says she has no questions for me (RN had asked me to see her). [facility], interpreter, was present for attempted consult.    Lurline Hare [facility] 05/04/2019, 12:51 PM

## 2019-05-09 ENCOUNTER — Ambulatory Visit (HOSPITAL_COMMUNITY): Payer: Self-pay

## 2019-05-16 ENCOUNTER — Ambulatory Visit (HOSPITAL_COMMUNITY): Payer: Self-pay

## 2019-05-16 ENCOUNTER — Other Ambulatory Visit: Payer: Self-pay

## 2019-05-16 ENCOUNTER — Ambulatory Visit (INDEPENDENT_AMBULATORY_CARE_PROVIDER_SITE_OTHER): Payer: Self-pay

## 2019-05-16 DIAGNOSIS — Z0130 Encounter for examination of blood pressure without abnormal findings: Secondary | ICD-10-CM

## 2019-05-16 MED ORDER — AMLODIPINE BESYLATE 5 MG PO TABS: 5.0000 mg | ORAL_TABLET | Freq: Every day | ORAL | 0 refills | Status: AC

## 2019-05-16 NOTE — Progress Notes (Signed)
Pt here today for BP check.  BP manually RA 150/88.  Recheck BP LA 142/98.  With video interpreter # [place] [phone] Pt reports having constant headaches however pt relates the headaches to the lack of sleep.  Notified Dr. Roselie Awkward who recommends that pt start Norvasc 5 mg po tablet daily and come back next week for BP check.  Pt verbalized understanding.

## 2019-05-17 ENCOUNTER — Ambulatory Visit: Payer: Self-pay

## 2019-05-21 ENCOUNTER — Telehealth: Payer: Self-pay | Admitting: Family Medicine

## 2019-05-21 NOTE — Telephone Encounter (Signed)
Attempted to call patient with Heidi Guzman Spanish interpreter to confirm appointment and go over process change. No answer, and number just rang for several message. Patient was not screened for symptoms due to no answer.

## 2019-05-22 ENCOUNTER — Ambulatory Visit: Payer: Self-pay

## 2019-05-23 ENCOUNTER — Ambulatory Visit (INDEPENDENT_AMBULATORY_CARE_PROVIDER_SITE_OTHER): Payer: Self-pay | Admitting: *Deleted

## 2019-05-23 ENCOUNTER — Other Ambulatory Visit: Payer: Self-pay

## 2019-05-23 ENCOUNTER — Ambulatory Visit (HOSPITAL_COMMUNITY): Payer: Self-pay

## 2019-05-23 VITALS — BP 128/77 | HR 87 | Wt 137.4 lb

## 2019-05-23 DIAGNOSIS — Z0130 Encounter for examination of blood pressure without abnormal findings: Secondary | ICD-10-CM

## 2019-05-23 DIAGNOSIS — O1494 Unspecified pre-eclampsia, complicating childbirth: Secondary | ICD-10-CM

## 2019-05-23 NOTE — Progress Notes (Signed)
Interpreter Eda Royal present for encounter. Pt was assessed for BP check @ 1440 as scheduled. BP - 128/77, P - 87. Pt reports H/A - pain scale 5. She denies any visual disturbances. Pt states she does not have help @ home and has not been getting much sleep. She thinks the H/A is due to lack of sleep. She has not tried Tylenol and I advised her to do so. Medication reconciliation completed. Pt encouraged to reach out to family, friends, church members and FOB for some assistance with care of baby and household so that she can get some rest. She will try. Pt is aware of virtual PP appt on 6/23. She voiced understanding of all information and instructions given.

## 2019-05-24 NOTE — Progress Notes (Signed)
I have reviewed the chart and agree with nursing staff's documentation of this patient's encounter.  Verita Schneiders, MD 05/24/2019 8:14 AM

## 2019-05-28 NOTE — Progress Notes (Signed)
Patient ID: Heidi Guzman, female   DOB: [birth year], 43 y.o.   MRN: [id number] I have reviewed the chart and agree with nursing staff's documentation of this patient's encounter.  Emeterio Reeve, MD 05/28/2019 10:11 PM

## 2019-05-30 ENCOUNTER — Telehealth: Payer: Self-pay | Admitting: Medical

## 2019-05-30 NOTE — Telephone Encounter (Signed)
Called the patient with the in office interrupter Etta to inform of the changes in with appointment time and date change to June 30th at 9:35am. Left the patient a detailed voicemail message.

## 2019-06-01 ENCOUNTER — Ambulatory Visit: Payer: Self-pay | Admitting: Medical

## 2019-06-05 ENCOUNTER — Ambulatory Visit: Payer: Self-pay | Admitting: Medical

## 2019-06-05 ENCOUNTER — Other Ambulatory Visit: Payer: Self-pay

## 2019-06-05 NOTE — Progress Notes (Signed)
CMA unable to reach patient for virtual visit after multiple attempts.   Luvenia Redden, PA-C 06/05/2019 10:18 AM

## 2019-06-05 NOTE — Progress Notes (Signed)
9:51a- called pt for webex meeting, no answer, left VM that I will call back in 10 to 15 mins.  10:02a-

## 2019-06-06 ENCOUNTER — Ambulatory Visit (INDEPENDENT_AMBULATORY_CARE_PROVIDER_SITE_OTHER): Payer: Self-pay | Admitting: Student

## 2019-06-06 ENCOUNTER — Encounter: Payer: Self-pay | Admitting: Student

## 2019-06-06 ENCOUNTER — Other Ambulatory Visit: Payer: Self-pay

## 2019-06-06 DIAGNOSIS — Z1389 Encounter for screening for other disorder: Secondary | ICD-10-CM

## 2019-06-06 NOTE — Progress Notes (Signed)
I connected with@ on 06/06/19 at  8:15 AM EDT by: webex and verified that I am speaking with the correct person using two identifiers.  Patient is located at home and provider is located at [facility]     The purpose of this virtual visit is to provide medical care while limiting exposure to the novel coronavirus. I discussed the limitations, risks, security and privacy concerns of performing an evaluation and management service by webex and the availability of in person appointments. I also discussed with the patient that there may be a patient responsible charge related to this service. By engaging in this virtual visit, you consent to the provision of healthcare.  Additionally, you authorize for your insurance to be billed for the services provided during this visit.  The patient expressed understanding and agreed to proceed.  The following staff members participated in the virtual visit:  Spanish interpreter Corazon Partum Visit Note Subjective:    Ms. Heidi Guzman is a 43 y.o. [phone] female who presents for a postpartum visit. She is 6 week postpartum following a spontaneous vaginal delivery. I have fully reviewed the prenatal and intrapartum course. The delivery was at 56 gestational weeks. Outcome: spontaneous vaginal delivery. Anesthesia: none. Postpartum course has been uneventful. Baby's course has been uneventful. Baby is feeding by breast. Bleeding no bleeding. Bowel function is abnormal: constipation. . Bladder function is normal. Patient is not sexually active. Contraception method is abstinence. Postpartum depression screening: negative. Patient reports constipation since delivery. Last BM was this morning but was small & hard. Has been taking metamucil daily without relief. Admits to only drinking 1-2 bottles of water per day.   The following portions of the patient's history were reviewed and updated as appropriate: allergies, current medications, past family history, past  medical history, past social history, past surgical history and problem list.  Review of Systems Pertinent items are noted in HPI.   Objective:  There were no vitals filed for this visit. Self-Obtained       Assessment:    Normal postpartum exam. Pap smear not done at today's visit. Last pap smear 2019 and results were normal. Next pap due 2022.   Plan:   1. Encounter for postpartum care and examination after delivery -pt does not have a BP cuff at home. Reports that she is still taking norvasc as prescribed. No symptoms concerning for PP PEC & pt is >4 weeks postpartum. Will check BP when she returns for PP labs.  -GDM- CBGs on PP floor were normal. Will schedule lab visit for PP GTT -Pt still interested in IUD. Her insurance is pending. Instructed patient to call office when her insurance is active. Maintains that she will not be sexually active at this time. Encouraged to use condoms if she is. -Discussed treatment of constipation. Strongly encouraged to increase water intake to 80-100 oz of water per day, especially while taking metamucil. If constipation does not improve she can take colace or miralax.    Jorje Guild, NP 06/06/2019 9:34 AM

## 2019-06-06 NOTE — Patient Instructions (Signed)
Health Maintenance, Female Adopting a healthy lifestyle and getting preventive care are important in promoting health and wellness. Ask your health care provider about:  The right schedule for you to have regular tests and exams.  Things you can do on your own to prevent diseases and keep yourself healthy. What should I know about diet, weight, and exercise? Eat a healthy diet   Eat a diet that includes plenty of vegetables, fruits, low-fat dairy products, and lean protein.  Do not eat a lot of foods that are high in solid fats, added sugars, or sodium. Maintain a healthy weight Body mass index (BMI) is used to identify weight problems. It estimates body fat based on height and weight. Your health care provider can help determine your BMI and help you achieve or maintain a healthy weight. Get regular exercise Get regular exercise. This is one of the most important things you can do for your health. Most adults should:  Exercise for at least 150 minutes each week. The exercise should increase your heart rate and make you sweat (moderate-intensity exercise).  Do strengthening exercises at least twice a week. This is in addition to the moderate-intensity exercise.  Spend less time sitting. Even light physical activity can be beneficial. Watch cholesterol and blood lipids Have your blood tested for lipids and cholesterol at 42 years of age, then have this test every 5 years. Have your cholesterol levels checked more often if:  Your lipid or cholesterol levels are high.  You are older than 42 years of age.  You are at high risk for heart disease. What should I know about cancer screening? Depending on your health history and family history, you may need to have cancer screening at various ages. This may include screening for:  Breast cancer.  Cervical cancer.  Colorectal cancer.  Skin cancer.  Lung cancer. What should I know about heart disease, diabetes, and high blood  pressure? Blood pressure and heart disease  High blood pressure causes heart disease and increases the risk of stroke. This is more likely to develop in people who have high blood pressure readings, are of African descent, or are overweight.  Have your blood pressure checked: ? Every 3-5 years if you are 18-39 years of age. ? Every year if you are 40 years old or older. Diabetes Have regular diabetes screenings. This checks your fasting blood sugar level. Have the screening done:  Once every three years after age 40 if you are at a normal weight and have a low risk for diabetes.  More often and at a younger age if you are overweight or have a high risk for diabetes. What should I know about preventing infection? Hepatitis B If you have a higher risk for hepatitis B, you should be screened for this virus. Talk with your health care provider to find out if you are at risk for hepatitis B infection. Hepatitis C Testing is recommended for:  Everyone born from 1945 through 1965.  Anyone with known risk factors for hepatitis C. Sexually transmitted infections (STIs)  Get screened for STIs, including gonorrhea and chlamydia, if: ? You are sexually active and are younger than 42 years of age. ? You are older than 42 years of age and your health care provider tells you that you are at risk for this type of infection. ? Your sexual activity has changed since you were last screened, and you are at increased risk for chlamydia or gonorrhea. Ask your health care provider if   you are at risk.  Ask your health care provider about whether you are at high risk for HIV. Your health care provider may recommend a prescription medicine to help prevent HIV infection. If you choose to take medicine to prevent HIV, you should first get tested for HIV. You should then be tested every 3 months for as long as you are taking the medicine. Pregnancy  If you are about to stop having your period (premenopausal) and  you may become pregnant, seek counseling before you get pregnant.  Take 400 to 800 micrograms (mcg) of folic acid every day if you become pregnant.  Ask for birth control (contraception) if you want to prevent pregnancy. Osteoporosis and menopause Osteoporosis is a disease in which the bones lose minerals and strength with aging. This can result in bone fractures. If you are 65 years old or older, or if you are at risk for osteoporosis and fractures, ask your health care provider if you should:  Be screened for bone loss.  Take a calcium or vitamin D supplement to lower your risk of fractures.  Be given hormone replacement therapy (HRT) to treat symptoms of menopause. Follow these instructions at home: Lifestyle  Do not use any products that contain nicotine or tobacco, such as cigarettes, e-cigarettes, and chewing tobacco. If you need help quitting, ask your health care provider.  Do not use street drugs.  Do not share needles.  Ask your health care provider for help if you need support or information about quitting drugs. Alcohol use  Do not drink alcohol if: ? Your health care provider tells you not to drink. ? You are pregnant, may be pregnant, or are planning to become pregnant.  If you drink alcohol: ? Limit how much you use to 0-1 drink a day. ? Limit intake if you are breastfeeding.  Be aware of how much alcohol is in your drink. In the [place] one drink equals one 12 oz bottle of beer (355 mL), one 5 oz glass of wine (148 mL), or one 1 oz glass of hard liquor (44 mL). General instructions  Schedule regular health, dental, and eye exams.  Stay current with your vaccines.  Tell your health care provider if: ? You often feel depressed. ? You have ever been abused or do not feel safe at home. Summary  Adopting a healthy lifestyle and getting preventive care are important in promoting health and wellness.  Follow your health care provider's instructions about healthy  diet, exercising, and getting tested or screened for diseases.  Follow your health care provider's instructions on monitoring your cholesterol and blood pressure. This information is not intended to replace advice given to you by your health care provider. Make sure you discuss any questions you have with your health care provider. Document Released: 06/07/2011 Document Revised: 11/15/2018 Document Reviewed: 11/15/2018 Elsevier Patient Education  2020 [employer]  

## 2019-06-11 ENCOUNTER — Other Ambulatory Visit: Payer: Self-pay | Admitting: *Deleted

## 2019-06-11 DIAGNOSIS — O24429 Gestational diabetes mellitus in childbirth, unspecified control: Secondary | ICD-10-CM

## 2019-06-12 ENCOUNTER — Telehealth: Payer: Self-pay | Admitting: Family Medicine

## 2019-06-12 NOTE — Telephone Encounter (Signed)
Spoke with patient w/ Spanish interpreter ID# [id number] 7/8 @ 8:50. Patient instructed to come fasting. Patient instructed to wear a face mask for the entire appointment and no visitors are allowed during the appointment. Patient screened for covid symptoms and denied having any.

## 2019-06-13 ENCOUNTER — Ambulatory Visit (INDEPENDENT_AMBULATORY_CARE_PROVIDER_SITE_OTHER): Payer: Self-pay

## 2019-06-13 ENCOUNTER — Other Ambulatory Visit: Payer: Self-pay

## 2019-06-13 VITALS — BP 126/76 | HR 75

## 2019-06-13 DIAGNOSIS — O24429 Gestational diabetes mellitus in childbirth, unspecified control: Secondary | ICD-10-CM

## 2019-06-13 DIAGNOSIS — Z0130 Encounter for examination of blood pressure without abnormal findings: Secondary | ICD-10-CM

## 2019-06-13 NOTE — Progress Notes (Signed)
Pt here today for BP check. BP 126/76 RA

## 2019-06-13 NOTE — Progress Notes (Signed)
Pt here today for BP check.  BP 126/76 RA.  With Spanish Interpreter Eda R., Pt denies any sx's of HTN.  Pt reports being prescribed Norvasc 5 mg tablet daily, she only has 4 tablets left, and has not taken BP medication today.  Notified Dr. Ilda Basset pt's BP.  Provider recommendation to discontinue use of Norvasc 5 mg po tablet.  Notified pt of provider's recommendation and to continue to monitor for sx's of elevated BP.  Pt verbalized understanding.    Mel Almond, RN 06/13/19

## 2019-06-14 ENCOUNTER — Telehealth: Payer: Self-pay

## 2019-06-14 LAB — GLUCOSE TOLERANCE, 2 HOURS
Glucose, 2 hour: 91 mg/dL (ref 65–139)
Glucose, GTT - Fasting: 99 mg/dL (ref 65–99)

## 2019-06-14 NOTE — Telephone Encounter (Addendum)
-----   Message from Luvenia Redden, PA-C sent at 06/14/2019  8:46 AM EDT ----- Patient passed PP 2 hour GTT. Please inform patient of results as she does not have MyChart.   Luvenia Redden, PA-C 06/14/2019 8:46 AM  Called pt with Spanish Interpreter Mariel G., and LM that her results from yesterday are normal and if she has any questions to please give the office a call.

## 2019-06-20 ENCOUNTER — Other Ambulatory Visit: Payer: Self-pay

## 2019-06-20 NOTE — Progress Notes (Signed)
Patient seen and assessed by nursing staff during this encounter. I have reviewed the chart and agree with the documentation and plan.  Bleu Minerd, MD 06/20/2019 2:33 PM    

## 2020-03-13 IMAGING — US US MFM OB FOLLOW UP
1 series · 14 of 28 positions shown · non-contrast
Comparison: none

[Series 1: us mfm ob follow up · 42 acquisitions, 14 frames shown]
[im 2/42]
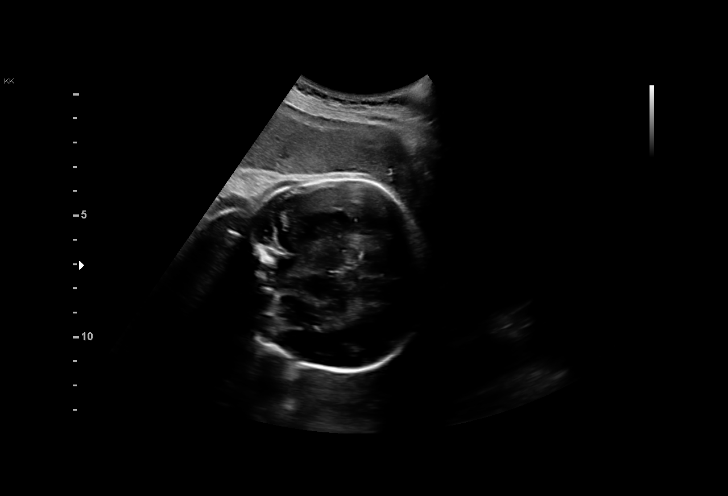
[im 5/42]
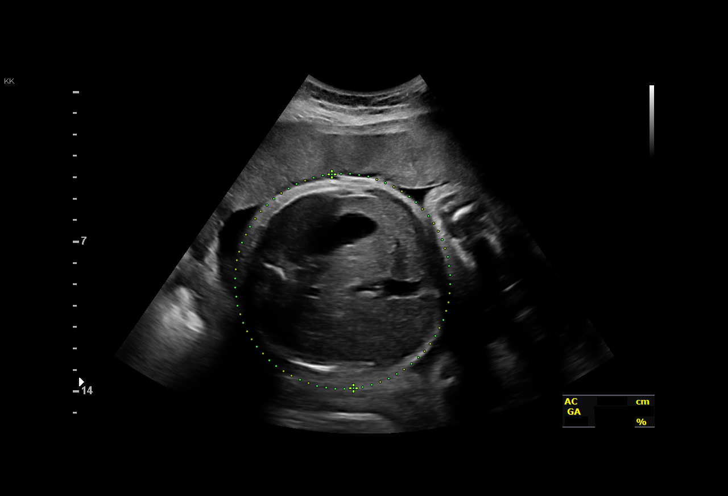
[im 8/42]
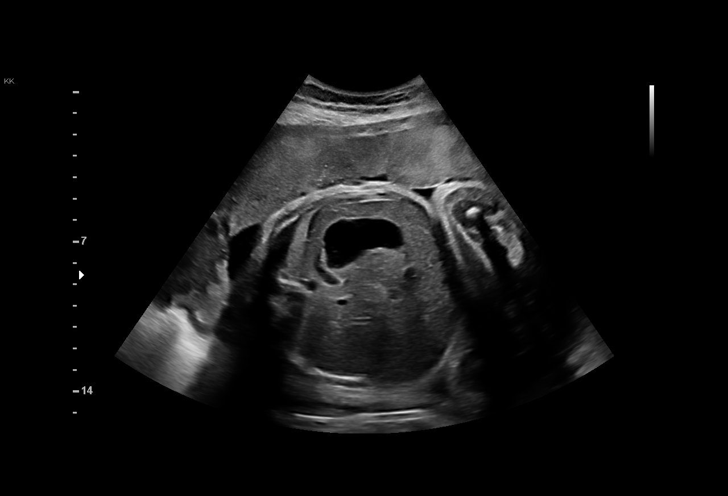
[im 11/42]
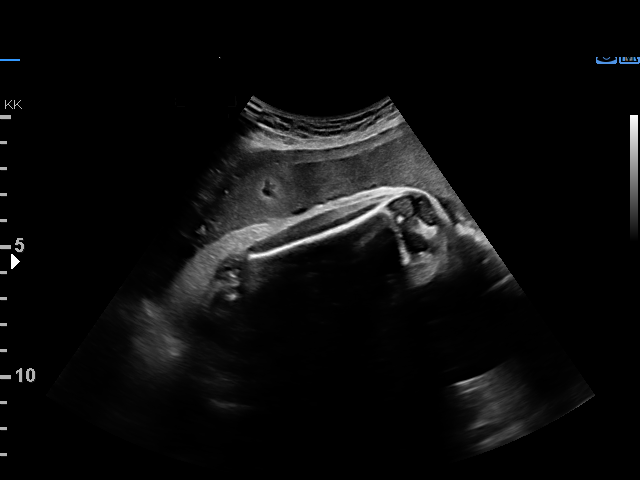
[im 14/42]
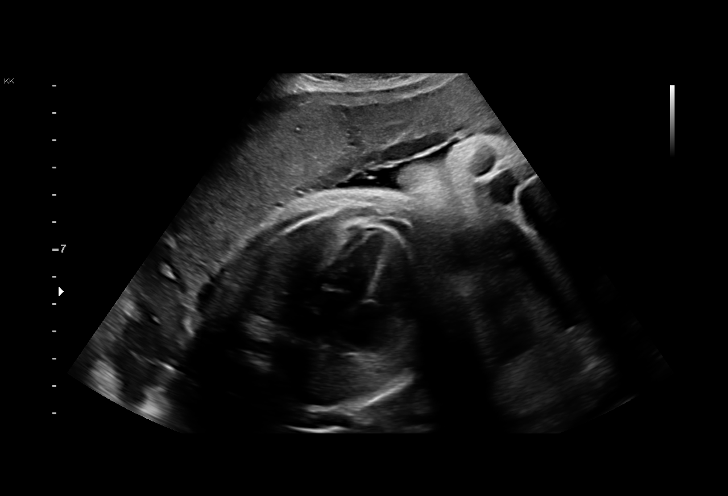
[im 17/42]
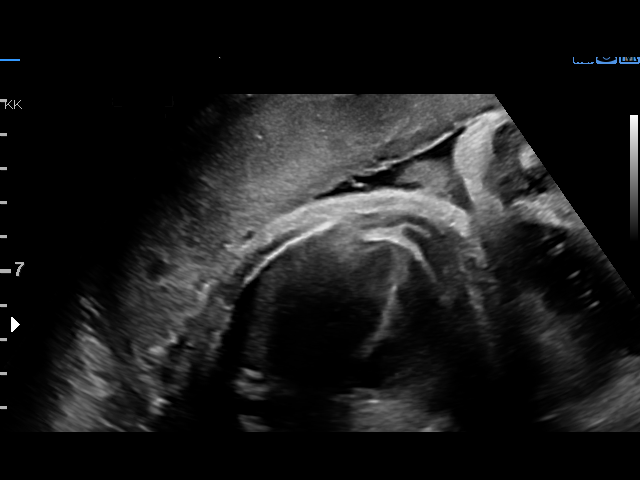
[im 20/42]
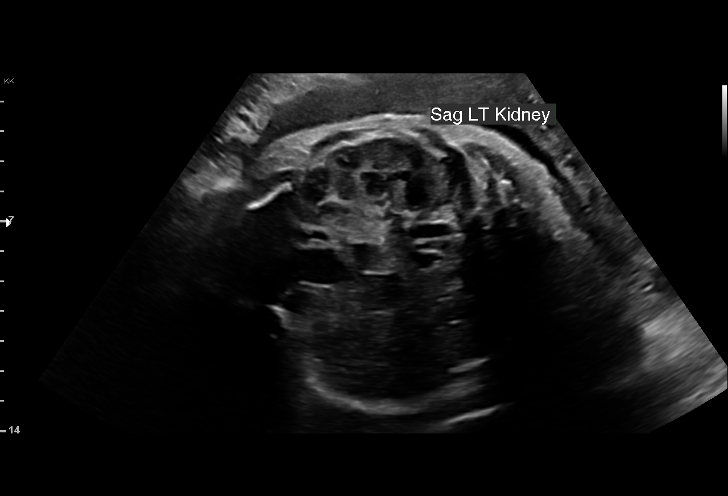
[im 23/42]
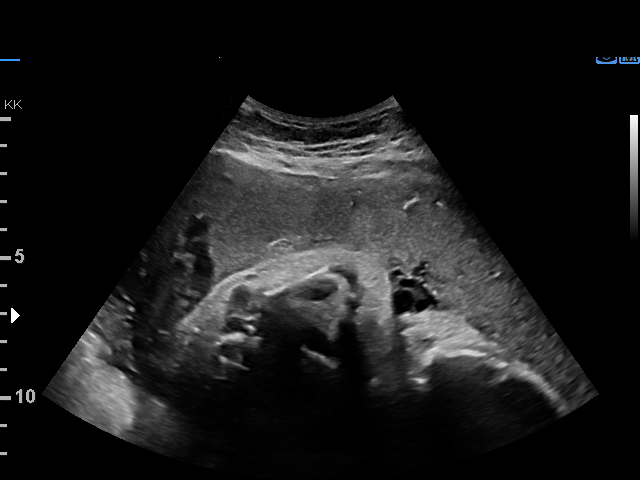
[im 26/42]
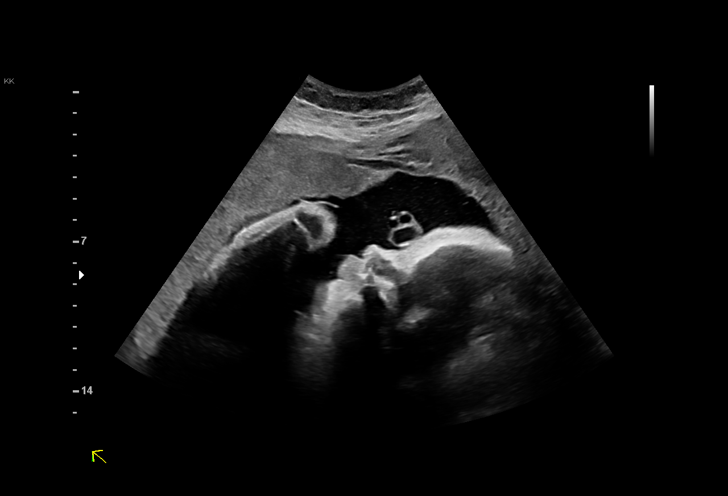
[im 29/42]
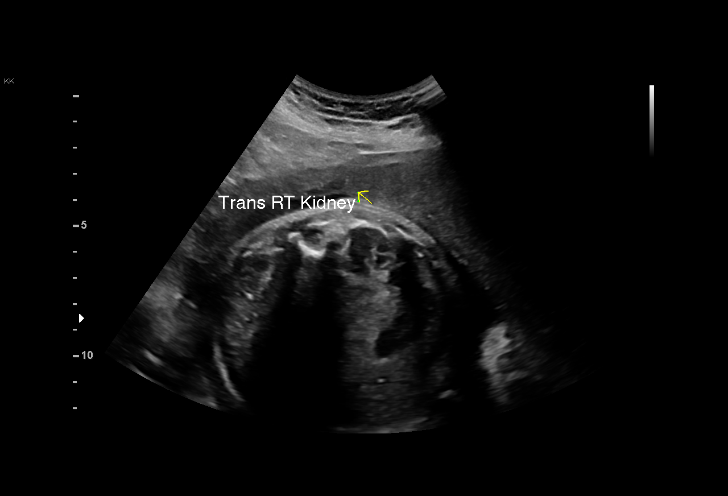
[im 32/42]
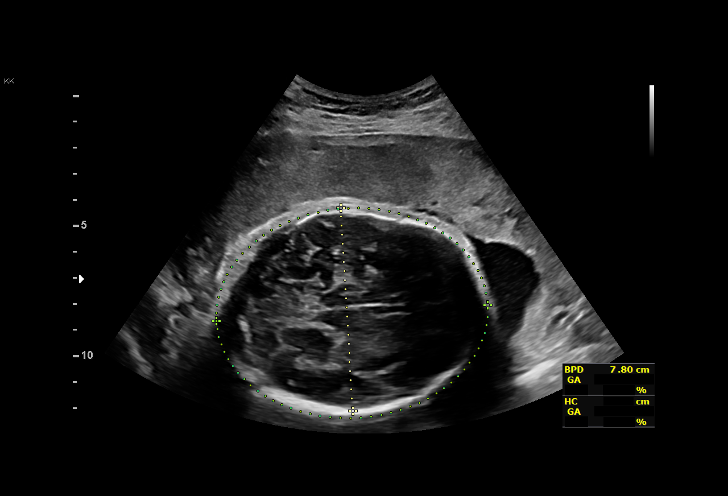
[im 35/42]
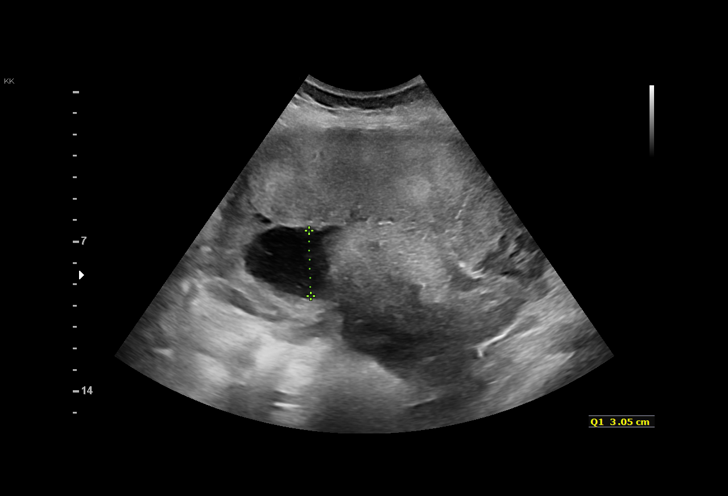
[im 38/42]
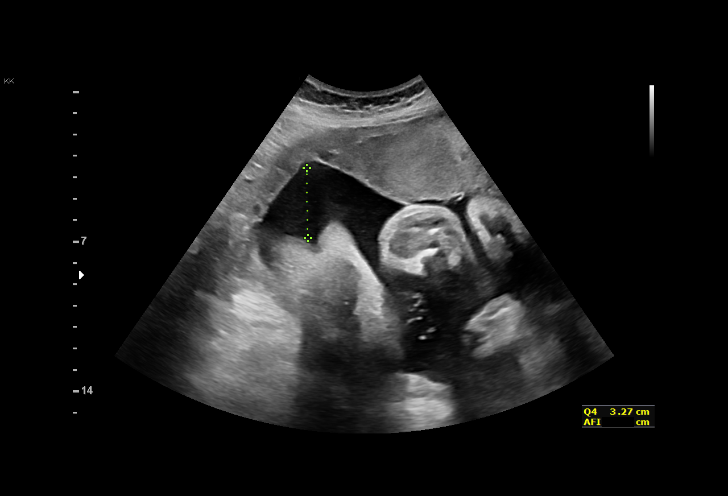
[im 42/42]
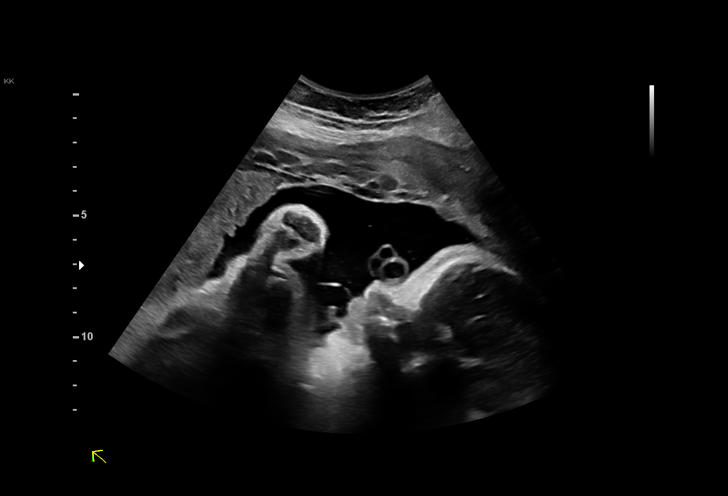

[14 of 28 positions shown; findings below may reference images not displayed]

[address]
                   [id number]

                                                       [place]
 ----------------------------------------------------------------------

 ----------------------------------------------------------------------
Indications

  32 weeks gestation of pregnancy
  Advanced maternal age multigravida 35+,
  second trimester (Declined Genetic
  Screening)( 43 yrs)
  Gestational diabetes in pregnancy, diet
  controlled
 ----------------------------------------------------------------------
Fetal Evaluation

 Num Of Fetuses:         1
 Fetal Heart Rate(bpm):  133
 Cardiac Activity:       Observed
 Presentation:           Cephalic
 Placenta:               Anterior
 P. Cord Insertion:      Previously Visualized

 Amniotic Fluid
 AFI FV:      Within normal limits

 AFI Sum(cm)     %Tile       Largest Pocket(cm)
 14.44           50

 RUQ(cm)       RLQ(cm)       LUQ(cm)        LLQ(cm)

Biometry

 BPD:      77.9  mm     G. Age:  31w 2d         17  %    CI:        70.94   %    70 - 86
                                                         FL/HC:      20.2   %    19.1 -
 HC:      294.7  mm     G. Age:  32w 4d         24  %    HC/AC:      0.90        0.96 -
 AC:      325.9  mm     G. Age:  36w 3d       > 97  %    FL/BPD:     76.4   %    71 - 87
 FL:       59.5  mm     G. Age:  31w 0d         13  %    FL/AC:      18.3   %    20 - 24

 Est. FW:    4686  gm      5 lb 3 oz     84  %
OB History

 Gravidity:    4         Term:   3        Prem:   0        SAB:   0
 TOP:          0       Ectopic:  0        Living: 3
Gestational Age

 LMP:           33w 3d        Date:  08/06/18                 EDD:   05/13/19
 U/S Today:     32w 6d                                        EDD:   05/17/19
 Best:          32w 1d     Det. By:  U/S  (12/27/18)          EDD:   05/22/19
Anatomy

 Cranium:               Appears normal         LVOT:                   Previously seen
 Cavum:                 Previously seen        Aortic Arch:            Appears normal
 Ventricles:            Appears normal         Ductal Arch:            Appears normal
 Choroid Plexus:        Resolved CPC prev      Diaphragm:              Previously seen
 Cerebellum:            Previously seen        Stomach:                Appears normal, left
                                                                       sided
 Posterior Fossa:       Previously seen        Abdomen:                Appears normal
 Nuchal Fold:           Previously seen        Abdominal Wall:         Previously seen
 Face:                  Orbits and profile     Cord Vessels:           Previously seen
                        previously seen
 Lips:                  Previously seen        Kidneys:                Appear normal
 Palate:                Not well visualized    Bladder:                Appears normal
 Thoracic:              Appears normal         Spine:                  Previously seen
 Heart:                 Appears normal         Upper Extremities:      Previously seen
                        (4CH, axis, and situs
 RVOT:                  Previously seen        Lower Extremities:      Previously seen

 Other:  Female gender previously seen. Open Right hand previously
         visualized.Technically difficult due to fetal position.
Cervix Uterus Adnexa

 Cervix
 Not visualized (advanced GA >39wks)
Impression

 Normal interval growth.
Recommendations

 Repeat growth in 4 weeks
 Initiate weekly testing at 36 weeks.
